# Patient Record
Sex: Female | Born: 1980
Health system: Southern US, Community
[De-identification: ages and names within clinical notes are randomized; demographics above are authoritative.]

## PROBLEM LIST (undated history)

## (undated) DIAGNOSIS — G43909 Migraine, unspecified, not intractable, without status migrainosus: Secondary | ICD-10-CM

## (undated) DIAGNOSIS — K5792 Diverticulitis of intestine, part unspecified, without perforation or abscess without bleeding: Secondary | ICD-10-CM

## (undated) DIAGNOSIS — B019 Varicella without complication: Secondary | ICD-10-CM

## (undated) DIAGNOSIS — F329 Major depressive disorder, single episode, unspecified: Secondary | ICD-10-CM

## (undated) DIAGNOSIS — N3090 Cystitis, unspecified without hematuria: Secondary | ICD-10-CM

## (undated) DIAGNOSIS — E079 Disorder of thyroid, unspecified: Secondary | ICD-10-CM

## (undated) DIAGNOSIS — O09529 Supervision of elderly multigravida, unspecified trimester: Secondary | ICD-10-CM

## (undated) DIAGNOSIS — Z9189 Other specified personal risk factors, not elsewhere classified: Secondary | ICD-10-CM

## (undated) DIAGNOSIS — B029 Zoster without complications: Secondary | ICD-10-CM

## (undated) DIAGNOSIS — D649 Anemia, unspecified: Secondary | ICD-10-CM

## (undated) HISTORY — DX: Migraine, unspecified, not intractable, without status migrainosus: G43.909

## (undated) HISTORY — DX: Anemia, unspecified: D64.9

## (undated) HISTORY — PX: RHINOPLASTY: SUR1284

## (undated) HISTORY — DX: Major depressive disorder, single episode, unspecified: F32.9

## (undated) HISTORY — DX: Diverticulitis of intestine, part unspecified, without perforation or abscess without bleeding: K57.92

## (undated) HISTORY — PX: APPENDECTOMY: SHX54

## (undated) HISTORY — DX: Other specified personal risk factors, not elsewhere classified: Z91.89

## (undated) HISTORY — DX: Varicella without complication: B01.9

## (undated) HISTORY — DX: Disorder of thyroid, unspecified: E07.9

---

## 2012-04-15 ENCOUNTER — Emergency Department: Payer: Self-pay | Admitting: Emergency Medicine

## 2012-04-15 LAB — URINALYSIS, COMPLETE
Blood: NEGATIVE
Glucose,UR: NEGATIVE mg/dL (ref 0–75)
Ketone: NEGATIVE
Nitrite: NEGATIVE
Ph: 7 (ref 4.5–8.0)
Protein: NEGATIVE
RBC,UR: 1 /HPF (ref 0–5)
Specific Gravity: 1.01 (ref 1.003–1.030)
Squamous Epithelial: 1
WBC UR: 1 /HPF (ref 0–5)

## 2012-04-15 LAB — LIPASE, BLOOD: Lipase: 209 U/L (ref 73–393)

## 2012-04-15 LAB — COMPREHENSIVE METABOLIC PANEL
Albumin: 3.8 g/dL (ref 3.4–5.0)
Alkaline Phosphatase: 64 U/L (ref 50–136)
Anion Gap: 6 — ABNORMAL LOW (ref 7–16)
BUN: 11 mg/dL (ref 7–18)
Chloride: 104 mmol/L (ref 98–107)
Co2: 27 mmol/L (ref 21–32)
Creatinine: 0.76 mg/dL (ref 0.60–1.30)
EGFR (African American): >60
Glucose: 89 mg/dL (ref 65–99)
Osmolality: 273 (ref 275–301)
Sodium: 137 mmol/L (ref 136–145)
Total Protein: 8.9 g/dL — ABNORMAL HIGH (ref 6.4–8.2)

## 2012-04-15 LAB — CBC
HCT: 31.1 % — ABNORMAL LOW (ref 35.0–47.0)
MCHC: 31.7 g/dL — ABNORMAL LOW (ref 32.0–36.0)
MCV: 77 fL — ABNORMAL LOW (ref 80–100)
RBC: 4.04 10*6/uL (ref 3.80–5.20)
RDW: 17.8 % — ABNORMAL HIGH (ref 11.5–14.5)
WBC: 8.6 10*3/uL (ref 3.6–11.0)

## 2014-01-14 DIAGNOSIS — Z9189 Other specified personal risk factors, not elsewhere classified: Secondary | ICD-10-CM

## 2014-01-14 HISTORY — DX: Other specified personal risk factors, not elsewhere classified: Z91.89

## 2015-01-06 ENCOUNTER — Encounter: Payer: Self-pay | Admitting: Emergency Medicine

## 2015-01-06 ENCOUNTER — Emergency Department
Admission: EM | Admit: 2015-01-06 | Discharge: 2015-01-06 | Disposition: A | Discharge status: Home / Self Care | Payer: 59 | Attending: Emergency Medicine | Admitting: Emergency Medicine

## 2015-01-06 ENCOUNTER — Emergency Department: Payer: 59

## 2015-01-06 DIAGNOSIS — R109 Unspecified abdominal pain: Secondary | ICD-10-CM

## 2015-01-06 DIAGNOSIS — N3000 Acute cystitis without hematuria: Secondary | ICD-10-CM | POA: Insufficient documentation

## 2015-01-06 DIAGNOSIS — Z3202 Encounter for pregnancy test, result negative: Secondary | ICD-10-CM | POA: Diagnosis not present

## 2015-01-06 DIAGNOSIS — R1909 Other intra-abdominal and pelvic swelling, mass and lump: Secondary | ICD-10-CM | POA: Insufficient documentation

## 2015-01-06 DIAGNOSIS — Z91040 Latex allergy status: Secondary | ICD-10-CM | POA: Insufficient documentation

## 2015-01-06 DIAGNOSIS — N9489 Other specified conditions associated with female genital organs and menstrual cycle: Secondary | ICD-10-CM

## 2015-01-06 LAB — COMPREHENSIVE METABOLIC PANEL
ALBUMIN: 4.6 g/dL (ref 3.5–5.0)
ALT: 23 U/L (ref 14–54)
AST: 21 U/L (ref 15–41)
Alkaline Phosphatase: 58 U/L (ref 38–126)
Anion gap: 6 (ref 5–15)
BUN: 10 mg/dL (ref 6–20)
CO2: 29 mmol/L (ref 22–32)
Calcium: 9.1 mg/dL (ref 8.9–10.3)
Chloride: 99 mmol/L — ABNORMAL LOW (ref 101–111)
Creatinine, Ser: 0.67 mg/dL (ref 0.44–1.00)
GFR calc Af Amer: >60 mL/min (ref >60)
GFR calc non Af Amer: >60 mL/min (ref >60)
GLUCOSE: 100 mg/dL — AB (ref 65–99)
Potassium: 3.5 mmol/L (ref 3.5–5.1)
SODIUM: 134 mmol/L — AB (ref 135–145)
Total Bilirubin: 0.3 mg/dL (ref 0.3–1.2)
Total Protein: 8.7 g/dL — ABNORMAL HIGH (ref 6.5–8.1)

## 2015-01-06 LAB — URINALYSIS COMPLETE WITH MICROSCOPIC (ARMC ONLY)
Bacteria, UA: NONE SEEN
Bilirubin Urine: NEGATIVE
GLUCOSE, UA: NEGATIVE mg/dL
Hgb urine dipstick: NEGATIVE
KETONES UR: NEGATIVE mg/dL
NITRITE: NEGATIVE
Protein, ur: 30 mg/dL — AB
SPECIFIC GRAVITY, URINE: 1.03 (ref 1.005–1.030)
pH: 5 (ref 5.0–8.0)

## 2015-01-06 LAB — CBC
HCT: 33.4 % — ABNORMAL LOW (ref 35.0–47.0)
HEMOGLOBIN: 10.7 g/dL — AB (ref 12.0–16.0)
MCH: 26.3 pg (ref 26.0–34.0)
MCHC: 32 g/dL (ref 32.0–36.0)
MCV: 82.2 fL (ref 80.0–100.0)
Platelets: 300 10*3/uL (ref 150–440)
RBC: 4.07 MIL/uL (ref 3.80–5.20)
RDW: 17 % — AB (ref 11.5–14.5)
WBC: 7.7 10*3/uL (ref 3.6–11.0)

## 2015-01-06 LAB — HCG, QUANTITATIVE, PREGNANCY: hCG, Beta Chain, Quant, S: <1 m[IU]/mL (ref <5)

## 2015-01-06 LAB — POCT PREGNANCY, URINE: Preg Test, Ur: NEGATIVE

## 2015-01-06 MED ORDER — KETOROLAC TROMETHAMINE 30 MG/ML IJ SOLN
30.0000 mg | Freq: Once | INTRAMUSCULAR | Status: AC
  Administered 2015-01-06: 30 mg via INTRAVENOUS
  Filled 2015-01-06: qty 1

## 2015-01-06 MED ORDER — SODIUM CHLORIDE 0.9 % IV BOLUS (SEPSIS)
1000.0000 mL | Freq: Once | INTRAVENOUS | Status: AC
  Administered 2015-01-06: 1000 mL via INTRAVENOUS

## 2015-01-06 MED ORDER — HYDROMORPHONE HCL 1 MG/ML IJ SOLN
1.0000 mg | INTRAMUSCULAR | Status: DC | PRN
  Administered 2015-01-06 (×2): 1 mg via INTRAVENOUS
  Filled 2015-01-06: qty 1

## 2015-01-06 MED ORDER — ONDANSETRON HCL 4 MG/2ML IJ SOLN
4.0000 mg | Freq: Once | INTRAMUSCULAR | Status: AC
  Administered 2015-01-06: 4 mg via INTRAVENOUS
  Filled 2015-01-06: qty 2

## 2015-01-06 MED ORDER — SULFAMETHOXAZOLE-TRIMETHOPRIM 800-160 MG PO TABS: 1.0000 | ORAL_TABLET | Freq: Two times a day (BID) | ORAL | Status: DC

## 2015-01-06 MED ORDER — OXYCODONE-ACETAMINOPHEN 5-325 MG PO TABS: 1.0000 | ORAL_TABLET | Freq: Four times a day (QID) | ORAL | Status: DC | PRN

## 2015-01-06 MED ORDER — HYDROMORPHONE HCL 1 MG/ML IJ SOLN
INTRAMUSCULAR | Status: AC
  Administered 2015-01-06: 1 mg via INTRAVENOUS
  Filled 2015-01-06: qty 1

## 2015-01-06 NOTE — ED Notes (Signed)
Reviewed d/c instructions, pain management, prescriptions, and follow-up care with pt.  Pt verbalized understanding.

## 2015-01-06 NOTE — ED Provider Notes (Signed)
[facility] Emergency Department Provider Note ____________________________________________  Time seen: 1715  I have reviewed the triage vital signs and the nursing notes.  HISTORY  Chief Complaint  Flank Pain  HPI Nicole MerlShasta Pandolfi is a 34 y.o. female reports to the ED for evaluation of sudden onset of right flank pain with referral to the right abdomen and pelvis. She describes the discomfort as severe cramps. She describes onset was shortly after reporting to work this morning. She denies any vomiting but has had intermittent nausea all day. She's been able to tolerate ginger ale, water, and a spring roll without vomiting. She initially thought her symptoms were related to some dyspepsia and earlier dosed Tums and Gas-X without significant relief. The pain intensified as the afternoon progressed. She's noted decreased urinary output, but denies any vaginal discharge or abnormal vaginal bleeding. She reports her LMP was 12/19/14. She denies any other symptoms at this time. She denies any recent travel, bad food, or sick contacts.  History reviewed. No pertinent past medical history.  There are no active problems to display for this patient.  Past Surgical History  Procedure Laterality Date  . Appendectomy    . Cesarean section     No current outpatient prescriptions on file.  Allergies Latex  History reviewed. No pertinent family history.  Social History Social History  Substance Use Topics  . Smoking status: Never Smoker   . Smokeless tobacco: None  . Alcohol Use: Yes   Review of Systems  Constitutional: Negative for fever. Eyes: Negative for visual changes. ENT: Negative for sore throat. Cardiovascular: Negative for chest pain. Respiratory: Negative for shortness of breath. Gastrointestinal:  Positivefor abdominal pain, pelvic pain, flank pain, and nausea. Negative for vomiting and diarrhea. Genitourinary: Negative for dysuria. Musculoskeletal:  Negative for back pain. Skin: Negative for rash. Neurological: Negative for headaches, focal weakness or numbness. ____________________________________________  PHYSICAL EXAM:  VITAL SIGNS: ED Triage Vitals  Enc Vitals Group     BP 01/06/15 1621 134/77 mmHg     Pulse Rate 01/06/15 1621 81     Resp 01/06/15 1621 20     Temp 01/06/15 1621 98 F (36.7 C)     Temp Source 01/06/15 1621 Oral     SpO2 01/06/15 1621 97 %     Weight 01/06/15 1621 215 lb (97.523 kg)     Height 01/06/15 1621 5\' 4"  (1.626 m)     Head Cir --      Peak Flow --      Pain Score 01/06/15 1621 8     Pain Loc --      Pain Edu? --      Excl. in GC? --    Constitutional: Alert and oriented. Well appearing and in no distress. Head: Normocephalic and atraumatic.      Eyes: Conjunctivae are normal. PERRL. Normal extraocular movements      Ears: Canals clear. TMs intact bilaterally.   Nose: No congestion/rhinorrhea.   Mouth/Throat: Mucous membranes are moist.   Neck: Supple. No thyromegaly. Hematological/Lymphatic/Immunological: No cervical lymphadenopathy. Cardiovascular: Normal rate, regular rhythm.  Respiratory: Normal respiratory effort. No wheezes/rales/rhonchi. Gastrointestinal: Soft and  Moderately tender to palp over the right lower quadrant. No distention, rebound, guarding, or organomegaly. GU: Deferred Musculoskeletal: Nontender with normal range of motion in all extremities.  Neurologic:  Normal gait without ataxia. Normal speech and language. No gross focal neurologic deficits are appreciated. Skin:  Skin is warm, dry and intact. No rash noted. Psychiatric: Mood and affect  are normal. Patient exhibits appropriate insight and judgment. ____________________________________________   LABS (pertinent positives/negatives) Labs Reviewed  COMPREHENSIVE METABOLIC PANEL - Abnormal; Notable for the following:    Sodium 134 (*)    Chloride 99 (*)    Glucose, Bld 100 (*)    Total Protein 8.7 (*)     All other components within normal limits  CBC - Abnormal; Notable for the following:    Hemoglobin 10.7 (*)    HCT 33.4 (*)    RDW 17.0 (*)    All other components within normal limits  URINALYSIS COMPLETEWITH MICROSCOPIC ([facility] ONLY) - Abnormal; Notable for the following:    Color, Urine AMBER (*)    APPearance CLOUDY (*)    Protein, ur 30 (*)    Leukocytes, UA 1+ (*)    Squamous Epithelial / LPF 6-30 (*)    All other components within normal limits  POC URINE PREG, ED  POCT PREGNANCY, URINE  ____________________________________________   RADIOLOGY  CT Stone Protocol IMPRESSION: 5.4 cm mixed attenuation right adnexal mass may represent hemorrhagic cyst, neoplasm, ovarian torsion. Recommend pelvic ultrasound for further characterization.  Pelvic/Transvaginal U/S  - pending  ____________________________________________  PROCEDURES  Toradol 30 mg IVP Zofran 4 mg IVP Dilaudid 1 mg IVP q2 hrs PRN NS 1000 ml bolus ____________________________________________  INITIAL IMPRESSION / ASSESSMENT AND PLAN / ED COURSE  Patient notify the time of chart transfer of CT result findings. She will be managed by my colleague and disposition will be determined based on this ultrasound results.  ____________________________________________  FINAL CLINICAL IMPRESSION(S) / ED DIAGNOSES  Final diagnoses:  Right flank pain  Adnexal mass       Lissa Hoard, PA-C 01/06/15 1924  Jeanmarie Plant, MD 01/06/15 2042

## 2015-01-06 NOTE — ED Provider Notes (Signed)
-----------------------------------------   8:32 PM on 01/06/2015 -----------------------------------------   Blood pressure 130/68, pulse 80, temperature 98 F (36.7 C), temperature source Oral, resp. rate 20, height 5\' 4"  (1.626 m), weight 97.523 kg, last menstrual period 12/19/2014, SpO2 98 %.  Assuming care from Ronald LoboJ. Bacon, PA-C.  In short, Nicole Mcintyre is a 34 y.o. female with a chief complaint of Flank Pain .  Refer to the original H&P for additional details.  The current plan of care is to await US results and manage pain.  ----------------------------------------- 11:00 PM on 01/06/2015 -----------------------------------------  Ultrasound findings were discussed with the radiologist. Because there is some resemblance of fetal pole, beta hCG will be added to the labs.   Dr. Vergie LivingPickens was consulted by phone. He advises pain management is a treatment of choice at this time. He states that he will see her in follow-up. She is to call and schedule an appointment.  The patient and her mother were advised of the ultrasound findings. Strict ER return precautions were discussed and patient verbalized understanding. She will be discharged with prescriptions for Percocet and Bactrim.    Chinita PesterCari B Dyon Rotert, FNP 01/06/15 [id number]  Loleta Roseory Forbach, MD 01/06/15 2311

## 2015-01-06 NOTE — ED Notes (Signed)
Sudden onset right flank pain and RLQ pain. Decreased UOP today.

## 2015-01-24 DIAGNOSIS — N920 Excessive and frequent menstruation with regular cycle: Secondary | ICD-10-CM | POA: Diagnosis not present

## 2015-01-24 DIAGNOSIS — N83201 Unspecified ovarian cyst, right side: Secondary | ICD-10-CM | POA: Diagnosis not present

## 2015-01-24 DIAGNOSIS — N946 Dysmenorrhea, unspecified: Secondary | ICD-10-CM | POA: Diagnosis not present

## 2015-02-22 DIAGNOSIS — R1030 Lower abdominal pain, unspecified: Secondary | ICD-10-CM | POA: Diagnosis not present

## 2015-02-22 DIAGNOSIS — N920 Excessive and frequent menstruation with regular cycle: Secondary | ICD-10-CM | POA: Diagnosis not present

## 2015-02-22 DIAGNOSIS — N946 Dysmenorrhea, unspecified: Secondary | ICD-10-CM | POA: Diagnosis not present

## 2015-02-22 DIAGNOSIS — R102 Pelvic and perineal pain: Secondary | ICD-10-CM | POA: Diagnosis not present

## 2015-02-22 DIAGNOSIS — N83201 Unspecified ovarian cyst, right side: Secondary | ICD-10-CM | POA: Diagnosis not present

## 2015-05-05 ENCOUNTER — Ambulatory Visit: Payer: Self-pay | Admitting: Family

## 2015-05-05 ENCOUNTER — Encounter: Payer: Self-pay | Admitting: Physician Assistant

## 2015-05-05 VITALS — BP 110/82 | HR 76 | Temp 98.5°F

## 2015-05-05 DIAGNOSIS — F4321 Adjustment disorder with depressed mood: Secondary | ICD-10-CM

## 2015-05-05 MED ORDER — SERTRALINE HCL 25 MG PO TABS: 25.0000 mg | ORAL_TABLET | Freq: Every day | ORAL | Status: DC

## 2015-05-05 NOTE — Progress Notes (Signed)
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 She has no prior hx of  Psychological issues.Med Hx :+ anemia /iron de , secondary to periods ,taking Multivit .   Fam Hx is negative for mental illness .She lives with BF of 3 yrs  who is moving out to give her space and because of her mood with him. He is ten years younger she states.  She is OCD and this effects her relationship with him as he is a Curatormechanic and comes in dirty and greasy.This is just one thing she notes.  She denies suicide ,homicide or work performance issues in fact has a Production assistant, radiopromotion.  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 She feels this was the beginning of a major stressor in her life , disappointment.   C/C irritability , occasionally extreme  crying .  O/ alert pleasant well groomed young lady , alert oriented x 3 , she is appropriate , verbalises well, shows humor and insight . VSS  A/ situational depression  P / rx of zoloft 25 mg one qd x 3-4 days,then may increase to 2 tabs daily .She is  To follow up with us in 3-4 weeks.She is looking for a primary care provider.  Continue Eap

## 2015-05-26 ENCOUNTER — Ambulatory Visit: Payer: Self-pay | Admitting: Physician Assistant

## 2015-05-26 ENCOUNTER — Encounter: Payer: Self-pay | Admitting: Physician Assistant

## 2015-05-26 VITALS — BP 130/79 | HR 82 | Temp 97.9°F

## 2015-05-26 DIAGNOSIS — F4321 Adjustment disorder with depressed mood: Secondary | ICD-10-CM

## 2015-05-26 MED ORDER — CITALOPRAM HYDROBROMIDE 20 MG PO TABS: 20.0000 mg | ORAL_TABLET | Freq: Every day | ORAL | Status: DC

## 2015-05-26 NOTE — Progress Notes (Signed)
S: c/o continued irritability and depression symptoms, started on zoloft, can't tell any difference, denies si/hi  O: vitals wnl, nad, lungs c t a, cv rrr, neuro intact  A: situational depression  P: celexa 20mg  qd

## 2015-07-03 ENCOUNTER — Encounter: Payer: Self-pay | Admitting: Family

## 2015-07-03 ENCOUNTER — Other Ambulatory Visit (INDEPENDENT_AMBULATORY_CARE_PROVIDER_SITE_OTHER): Payer: 59

## 2015-07-03 ENCOUNTER — Ambulatory Visit (INDEPENDENT_AMBULATORY_CARE_PROVIDER_SITE_OTHER): Payer: 59 | Admitting: Family

## 2015-07-03 VITALS — BP 110/80 | HR 83 | Temp 98.0°F | Resp 16 | Ht 64.25 in | Wt 268.0 lb

## 2015-07-03 DIAGNOSIS — R5383 Other fatigue: Secondary | ICD-10-CM

## 2015-07-03 DIAGNOSIS — M25521 Pain in right elbow: Secondary | ICD-10-CM | POA: Insufficient documentation

## 2015-07-03 LAB — CBC
HEMATOCRIT: 33.4 % — AB (ref 36.0–46.0)
HEMOGLOBIN: 11.3 g/dL — AB (ref 12.0–15.0)
MCHC: 33.7 g/dL (ref 30.0–36.0)
MCV: 87.7 fl (ref 78.0–100.0)
Platelets: 284 10*3/uL (ref 150.0–400.0)
RBC: 3.81 Mil/uL — ABNORMAL LOW (ref 3.87–5.11)
RDW: 18.1 % — AB (ref 11.5–15.5)
WBC: 5.7 10*3/uL (ref 4.0–10.5)

## 2015-07-03 LAB — COMPREHENSIVE METABOLIC PANEL
ALT: 30 U/L (ref 0–35)
AST: 20 U/L (ref 0–37)
Albumin: 4 g/dL (ref 3.5–5.2)
Alkaline Phosphatase: 52 U/L (ref 39–117)
BUN: 12 mg/dL (ref 6–23)
CHLORIDE: 103 meq/L (ref 96–112)
CO2: 24 meq/L (ref 19–32)
Calcium: 9.1 mg/dL (ref 8.4–10.5)
Creatinine, Ser: 0.79 mg/dL (ref 0.40–1.20)
GFR: 106.55 mL/min (ref >60.00)
GLUCOSE: 96 mg/dL (ref 70–99)
POTASSIUM: 4.3 meq/L (ref 3.5–5.1)
Sodium: 137 mEq/L (ref 135–145)
Total Bilirubin: 0.3 mg/dL (ref 0.2–1.2)
Total Protein: 7.9 g/dL (ref 6.0–8.3)

## 2015-07-03 LAB — IBC PANEL
IRON: 26 ug/dL — AB (ref 42–145)
Saturation Ratios: 5.9 % — ABNORMAL LOW (ref 20.0–50.0)
TRANSFERRIN: 317 mg/dL (ref 212.0–360.0)

## 2015-07-03 LAB — HEMOGLOBIN A1C: Hgb A1c MFr Bld: 5.3 % (ref 4.6–6.5)

## 2015-07-03 LAB — VITAMIN D 25 HYDROXY (VIT D DEFICIENCY, FRACTURES): VITD: 29.54 ng/mL — AB (ref 30.00–100.00)

## 2015-07-03 LAB — TSH: TSH: 1.56 u[IU]/mL (ref 0.35–4.50)

## 2015-07-03 NOTE — Assessment & Plan Note (Signed)
BMI of 45.64. Encourage weight loss of 5-10% of current body weight through lifestyle changes. Recommend increasing physical activity to 30 minutes of moderate level activity daily. Encourage nutritional intake that focuses on nutrient dense foods and is moderate, varied, and balanced and is low in saturated fats and processed/sugary foods. Continue to monitor.

## 2015-07-03 NOTE — Progress Notes (Signed)
Subjective:    Patient ID: Nicole Mcintyre, female    DOB: [birth year], 35 y.o.   MRN: [id number]  Chief Complaint  Patient presents with  . Establish Care    TSH check last PA that saw her said she had lumps on her thyroid    HPI:  Nicole MerlShasta Davilla is a 35 y.o. female who  has a past medical history of Depression; Diverticulitis; History of fainting spells of unknown cause; Migraines; Chicken pox; and Thyroid disease. and presents today for an office visit to establish care.   1.) Thyroid disease - Previously diagnosed with thyroid disease. Originally started with mood changes and feeling angry which led her to a counselor. Upon evaluation in employee health it was found that she may have had lumps on her thyroid. No blood work was completed at the time. Since her depression medication has been discontinued. Currently experiencing the associated symptom of gaining weight, decreased appetitie, and hair is dry/coarse compared to where it normally has been. Describes being hot all the time. Does have a history of heavy menstrual cycle averaging about 7 days of heavy menstruation. Not currently on birth control.  2.) Right elbow pain - this is a new problem. Associated symptom of a small mass and pain located in her right elbow has been going on for several weeks. Pain is described as sharp at times. Denies trauma. She is right-hand dominant. Modifying factors include ibuprofen which have helped minimally with her pain. Course of the symptoms appear to be gradually worsening. Does lean on her elbows and her work as a Administrator, sportspatient accounting. Denies numbness or tingling in her distal extremities.   Allergies  Allergen Reactions  . Latex Swelling     Outpatient Prescriptions Prior to Visit  Medication Sig Dispense Refill  . citalopram (CELEXA) 20 MG tablet Take 1 tablet (20 mg total) by mouth daily. 30 tablet 3  . sertraline (ZOLOFT) 25 MG tablet Take 1 tablet (25 mg total) by mouth daily. For  mood/depression. Take 1 tab QD x 3-4 days then increase to 2 tabs QD 60 tablet 0   No facility-administered medications prior to visit.     Past Medical History  Diagnosis Date  . Depression   . Diverticulitis   . History of fainting spells of unknown cause   . Migraines   . Chicken pox   . Thyroid disease      Past Surgical History  Procedure Laterality Date  . Appendectomy    . Cesarean section    . Rhinoplasty       Family History  Problem Relation Age of Onset  . Alcohol abuse Father   . Diabetes Father   . Arthritis Maternal Grandmother   . Breast cancer Maternal Grandmother   . Alcohol abuse Maternal Grandfather   . Diabetes Paternal Grandmother   . Healthy Mother   . Healthy Paternal Grandfather      Social History   Social History  . Marital Status: Single    Spouse Name: N/A  . Number of Children: 1  . Years of Education: 14   Occupational History  . Patient Accounting    Social History Main Topics  . Smoking status: Never Smoker   . Smokeless tobacco: Never Used  . Alcohol Use: 0.6 oz/week    0 Standard drinks or equivalent, 1 Glasses of wine per week  . Drug Use: No  . Sexual Activity: Not on file   Other Topics Concern  . Not on file  Social History Narrative   Fun: Draw, vacation, visit family    Review of Systems  Constitutional: Positive for fatigue. Negative for fever and chills.  Respiratory: Negative for chest tightness and shortness of breath.   Endocrine: Positive for heat intolerance. Negative for cold intolerance.  Genitourinary: Positive for genital sores.      Objective:    BP 110/80 mmHg  Pulse 83  Temp(Src) 98 F (36.7 C) (Oral)  Resp 16  Ht 5' 4.25" (1.632 m)  Wt 268 lb (121.564 kg)  BMI 45.64 kg/m2  SpO2 98% Nursing note and vital signs reviewed.  Physical Exam  Constitutional: She is oriented to person, place, and time. She appears well-developed and well-nourished. No distress.  Cardiovascular: Normal  rate, regular rhythm, normal heart sounds and intact distal pulses.   Pulmonary/Chest: Effort normal and breath sounds normal.  Musculoskeletal:  Right elbow - no obvious deformity, discoloration, or edema. Palpable firm, mobile nodule located posterior to the olecranon process and tender to the touch. Range of motion is within normal limits. Strength is normal. Distal pulses and sensation are intact and appropriate.  Neurological: She is alert and oriented to person, place, and time.  Skin: Skin is warm and dry.  Psychiatric: She has a normal mood and affect. Her behavior is normal. Judgment and thought content normal.       Assessment & Plan:   Problem List Items Addressed This Visit      Other   Fatigue - Primary    Symptoms of fatigue and mood changes with questionable origin and previously concern for thyroid issues. Does have significant history of anemia.  Obtain CBC, CMET, A1c, IBC panel, TSH and Vitamin D to check metabolic causes. Does endorse snorning while sleeping with concern for potential sleep apnea. Encouraged weight loss with BMI of 45. Follow up and additional treatment pending blood work results.       Relevant Orders   CBC   Comprehensive metabolic panel   TSH   Hemoglobin A1c   IBC panel   VITAMIN D 25 Hydroxy (Vit-D Deficiency, Fractures)   Obesity, morbid (HCC)    BMI of 45.64. Encourage weight loss of 5-10% of current body weight through lifestyle changes. Recommend increasing physical activity to 30 minutes of moderate level activity daily. Encourage nutritional intake that focuses on nutrient dense foods and is moderate, varied, and balanced and is low in saturated fats and processed/sugary foods. Continue to monitor.      Right elbow pain    Right elbow pain with concern for bursitis or possible cyst. Treat conservatively with ice and over-the-counter medications as needed for symptom relief and supportive care. Consider ultrasound and possible fine-needle  aspiration if symptoms worsen or do not improve.          I have discontinued Ms. Pae sertraline and citalopram. I am also having her maintain her doxylamine (Sleep) and multivitamin.   Meds ordered this encounter  Medications  . doxylamine, Sleep, (UNISOM) 25 MG tablet    Sig: Take 25 mg by mouth at bedtime as needed.  . Multiple Vitamin (MULTIVITAMIN) capsule    Sig: Take 1 capsule by mouth daily.     Follow-up: Return if symptoms worsen or fail to improve.  Jeanine Luz, FNP

## 2015-07-03 NOTE — Assessment & Plan Note (Signed)
Symptoms of fatigue and mood changes with questionable origin and previously concern for thyroid issues. Does have significant history of anemia.  Obtain CBC, CMET, A1c, IBC panel, TSH and Vitamin D to check metabolic causes. Does endorse snorning while sleeping with concern for potential sleep apnea. Encouraged weight loss with BMI of 45. Follow up and additional treatment pending blood work results.

## 2015-07-03 NOTE — Assessment & Plan Note (Signed)
Right elbow pain with concern for bursitis or possible cyst. Treat conservatively with ice and over-the-counter medications as needed for symptom relief and supportive care. Consider ultrasound and possible fine-needle aspiration if symptoms worsen or do not improve.

## 2015-07-03 NOTE — Progress Notes (Signed)
Pre visit review using our clinic review tool, if applicable. No additional management support is needed unless otherwise documented below in the visit note. 

## 2015-07-03 NOTE — Patient Instructions (Addendum)
Thank you for choosing [facility].  Summary/Instructions:  Ice 2-3 times per day and after work as needed.  Tylenol as needed for discomfort until blood work returns.  Please stop by the lab on the basement level of the building for your blood work. Your results will be released to MyChart (or called to you) after review, usually within 72 hours after test completion. If any changes need to be made, you will be notified at that same time.  If your symptoms worsen or fail to improve, please contact our office for further instruction, or in case of emergency go directly to the emergency room at the closest medical facility.    Fatigue Fatigue is feeling tired all of the time, a lack of energy, or a lack of motivation. Occasional or mild fatigue is often a normal response to activity or life in general. However, long-lasting (chronic) or extreme fatigue may indicate an underlying medical condition. HOME CARE INSTRUCTIONS  Watch your fatigue for any changes. The following actions may help to lessen any discomfort you are feeling:  Talk to your health care provider about how much sleep you need each night. Try to get the required amount every night.  Take medicines only as directed by your health care provider.  Eat a healthy and nutritious diet. Ask your health care provider if you need help changing your diet.  Drink enough fluid to keep your urine clear or pale yellow.  Practice ways of relaxing, such as yoga, meditation, massage therapy, or acupuncture.  Exercise regularly.   Change situations that cause you stress. Try to keep your work and personal routine reasonable.  Do not abuse illegal drugs.  Limit alcohol intake to no more than 1 drink per day for nonpregnant women and 2 drinks per day for men. One drink equals 12 ounces of beer, 5 ounces of wine, or 1 ounces of hard liquor.  Take a multivitamin, if directed by your health care provider. SEEK MEDICAL CARE IF:    Your fatigue does not get better.  You have a fever.   You have unintentional weight loss or gain.  You have headaches.   You have difficulty:   Falling asleep.  Sleeping throughout the night.  You feel angry, guilty, anxious, or sad.   You are unable to have a bowel movement (constipation).   You skin is dry.   Your legs or another part of your body is swollen.  SEEK IMMEDIATE MEDICAL CARE IF:   You feel confused.   Your vision is blurry.  You feel faint or pass out.   You have a severe headache.   You have severe abdominal, pelvic, or back pain.   You have chest pain, shortness of breath, or an irregular or fast heartbeat.   You are unable to urinate or you urinate less than normal.   You develop abnormal bleeding, such as bleeding from the rectum, vagina, nose, lungs, or nipples.  You vomit blood.   You have thoughts about harming yourself or committing suicide.   You are worried that you might harm someone else.    This information is not intended to replace advice given to you by your health care provider. Make sure you discuss any questions you have with your health care provider.   Document Released: 10/28/2006 Document Revised: 01/21/2014 Document Reviewed: 05/04/2013 Elsevier Interactive Patient Education [employer].

## 2015-07-04 ENCOUNTER — Encounter: Payer: Self-pay | Admitting: Family

## 2015-11-13 ENCOUNTER — Emergency Department
Admission: EM | Admit: 2015-11-13 | Discharge: 2015-11-13 | Disposition: A | Discharge status: Home / Self Care | Payer: 59 | Attending: Emergency Medicine | Admitting: Emergency Medicine

## 2015-11-13 ENCOUNTER — Emergency Department: Payer: 59

## 2015-11-13 ENCOUNTER — Encounter: Payer: Self-pay | Admitting: Emergency Medicine

## 2015-11-13 DIAGNOSIS — R079 Chest pain, unspecified: Secondary | ICD-10-CM | POA: Diagnosis not present

## 2015-11-13 DIAGNOSIS — R071 Chest pain on breathing: Secondary | ICD-10-CM | POA: Diagnosis present

## 2015-11-13 DIAGNOSIS — R0789 Other chest pain: Secondary | ICD-10-CM | POA: Insufficient documentation

## 2015-11-13 LAB — COMPREHENSIVE METABOLIC PANEL WITH GFR
ALT: 22 U/L (ref 14–54)
AST: 21 U/L (ref 15–41)
Albumin: 3.8 g/dL (ref 3.5–5.0)
Alkaline Phosphatase: 49 U/L (ref 38–126)
Anion gap: 6 (ref 5–15)
BUN: 10 mg/dL (ref 6–20)
CO2: 26 mmol/L (ref 22–32)
Calcium: 9.2 mg/dL (ref 8.9–10.3)
Chloride: 100 mmol/L — ABNORMAL LOW (ref 101–111)
Creatinine, Ser: 0.79 mg/dL (ref 0.44–1.00)
GFR calc Af Amer: >60 mL/min
GFR calc non Af Amer: >60 mL/min
Glucose, Bld: 91 mg/dL (ref 65–99)
Potassium: 3.7 mmol/L (ref 3.5–5.1)
Sodium: 132 mmol/L — ABNORMAL LOW (ref 135–145)
Total Bilirubin: <0.1 mg/dL — ABNORMAL LOW (ref 0.3–1.2)
Total Protein: 7.9 g/dL (ref 6.5–8.1)

## 2015-11-13 LAB — POCT PREGNANCY, URINE: PREG TEST UR: NEGATIVE

## 2015-11-13 LAB — CBC
HCT: 33.3 % — ABNORMAL LOW (ref 35.0–47.0)
Hemoglobin: 11.1 g/dL — ABNORMAL LOW (ref 12.0–16.0)
MCH: 29.5 pg (ref 26.0–34.0)
MCHC: 33.5 g/dL (ref 32.0–36.0)
MCV: 88 fL (ref 80.0–100.0)
Platelets: 222 K/uL (ref 150–440)
RBC: 3.78 MIL/uL — ABNORMAL LOW (ref 3.80–5.20)
RDW: 15.9 % — ABNORMAL HIGH (ref 11.5–14.5)
WBC: 6.1 K/uL (ref 3.6–11.0)

## 2015-11-13 LAB — FIBRIN DERIVATIVES D-DIMER (ARMC ONLY): FIBRIN DERIVATIVES D-DIMER (ARMC): 303 (ref 0–499)

## 2015-11-13 LAB — TROPONIN I: Troponin I: <0.03 ng/mL

## 2015-11-13 NOTE — ED Notes (Signed)
Pt discharged home after verbalizing understanding of discharge instructions; nad noted. 

## 2015-11-13 NOTE — ED Triage Notes (Signed)
Pt reports chest pain since last night. States the pain is centralized in her chest and radiates through to back. Pt states that pain increases with deep inspiration. Denies nausea, vomiting, and dizziness. Reports that she has had similar pains in the past but this once has lasted longer than normal.

## 2015-11-13 NOTE — ED Notes (Addendum)
Pt reports constant mid-sternal chest pain that radiates to back since yesterday at 3PM. She reports that when she takes a deep breath it is worse. She states that she has had it before, and that it usually goes away within 30 minutes. Denies pain in her calves, sob.

## 2015-11-13 NOTE — ED Provider Notes (Signed)
[facility] Emergency Department Provider Note   ____________________________________________    I have reviewed the triage vital signs and the nursing notes.   HISTORY  Chief Complaint Chest Pain     HPI Nicole MerlShasta Mcintyre is a 35 y.o. female who presents with moderate, aching complaints of chest pain. Patient reports she has been having chest pain in the center of her chest for approximately 24 hours. She reports pain worsens when she takes deep breath. She does smoke intermittently. She has had pain similar to this in the past but it is only lasted minutes at most. She denies recent travel. She denies calf pain or swelling. No fevers or chills. No cough.   Past Medical History:  Diagnosis Date  . Anemia   . Chicken pox   . Depression   . Diverticulitis   . History of fainting spells of unknown cause   . Migraines   . Thyroid disease     Patient Active Problem List   Diagnosis Date Noted  . Fatigue 07/03/2015  . Obesity, morbid (HCC) 07/03/2015  . Right elbow pain 07/03/2015    Past Surgical History:  Procedure Laterality Date  . APPENDECTOMY    . CESAREAN SECTION    . RHINOPLASTY      Prior to Admission medications   Medication Sig Start Date End Date Taking? Authorizing Provider  doxylamine, Sleep, (UNISOM) 25 MG tablet Take 25 mg by mouth at bedtime as needed.    Historical Provider, MD  Multiple Vitamin (MULTIVITAMIN) capsule Take 1 capsule by mouth daily.    Historical Provider, MD     Allergies Latex  Family History  Problem Relation Age of Onset  . Alcohol abuse Father   . Diabetes Father   . Arthritis Maternal Grandmother   . Breast cancer Maternal Grandmother   . Alcohol abuse Maternal Grandfather   . Diabetes Paternal Grandmother   . Healthy Mother   . Healthy Paternal Grandfather     Social History Social History  Substance Use Topics  . Smoking status: Never Smoker  . Smokeless tobacco: Never Used  . Alcohol  use 0.6 oz/week    1 Glasses of wine per week    Review of Systems  Constitutional: No fever/chills   Cardiovascular: As above Respiratory: Mild shortness of breath. Gastrointestinal: No abdominal pain.  No nausea, no vomiting.    Musculoskeletal: Pain radiates to back Skin: Negative for rash. Neurological: Negative for headaches or weakness  10-point ROS otherwise negative.  ____________________________________________   PHYSICAL EXAM:  VITAL SIGNS: ED Triage Vitals  Enc Vitals Group     BP 11/13/15 0918 (!) 111/92     Pulse Rate 11/13/15 0918 89     Resp 11/13/15 0918 16     Temp 11/13/15 0918 98.3 F (36.8 C)     Temp Source 11/13/15 0918 Oral     SpO2 11/13/15 0918 96 %     Weight 11/13/15 0919 240 lb (108.9 kg)     Height 11/13/15 0919 5\' 4"  (1.626 m)     Head Circumference --      Peak Flow --      Pain Score 11/13/15 0917 7     Pain Loc --      Pain Edu? --      Excl. in GC? --     Constitutional: Alert and oriented. No acute distress. Pleasant and interactive Eyes: Conjunctivae are normal.   Nose: No congestion/rhinnorhea. Mouth/Throat: Mucous membranes are moist.  Cardiovascular: Normal rate, regular rhythm. Grossly normal heart sounds.  Good peripheral circulation. Tenderness to palpation along the inferior bilateral borders of the sternum Respiratory: Normal respiratory effort.  No retractions. Lungs CTAB. Gastrointestinal: Soft and nontender. No distention.  No CVA tenderness. Genitourinary: deferred Musculoskeletal: No lower extremity tenderness nor edema.  Warm and well perfused Neurologic:  Normal speech and language. No gross focal neurologic deficits are appreciated.  Skin:  Skin is warm, dry and intact. No rash noted. Psychiatric: Mood and affect are normal. Speech and behavior are normal.  ____________________________________________   LABS (all labs ordered are listed, but only abnormal results are displayed)  Labs Reviewed  CBC    COMPREHENSIVE METABOLIC PANEL  TROPONIN I  FIBRIN DERIVATIVES D-DIMER ([facility] ONLY)   ____________________________________________  EKG  ED ECG REPORT I, Nicole EveryKINNER, Chrishonda Hesch, the attending physician, personally viewed and interpreted this ECG.  Date: 11/13/2015 EKG Time: 9:17 AM Rate: 88 Rhythm: normal sinus rhythm QRS Axis: normal Intervals: normal ST/T Wave abnormalities: normal Conduction Disturbances: none Narrative Interpretation: unremarkable  ____________________________________________  RADIOLOGY  Chest x-ray unremarkable ____________________________________________   PROCEDURES  Procedure(s) performed: No    Critical Care performed: No ____________________________________________   INITIAL IMPRESSION / ASSESSMENT AND PLAN / ED COURSE  Pertinent labs & imaging results that were available during my care of the patient were reviewed by me and considered in my medical decision making (see chart for details).  Patient presents with chest pain with a pleuritic component. No risk factors for PE, we will send d-dimer, labs, chest x-ray and reevaluate.  Clinical Course  Lab work, x-ray is reassuring. Given sternal chest discomfort suspect costochondritis, recommend NSAIDs and supportive care. Return precautions discussed. ____________________________________________   FINAL CLINICAL IMPRESSION(S) / ED DIAGNOSES  Final diagnoses:  Atypical chest pain      NEW MEDICATIONS STARTED DURING THIS VISIT:  New Prescriptions   No medications on file     Note:  This document was prepared using Dragon voice recognition software and may include unintentional dictation errors.    Nicole Everyobert Tate Jerkins, MD 11/13/15 [phone]

## 2015-11-22 ENCOUNTER — Telehealth: Payer: Self-pay | Admitting: Family

## 2015-11-22 NOTE — Telephone Encounter (Signed)
[facility] - Client TELEPHONE ADVICE RECORD [facility] Patient Name: Nicole Mcintyre DOB: 21-Feb-1980 Initial Comment Caller states went to ER last Mon for chest pains, test came back fine, still having chest pain and is having pain when she coughs. Nurse Assessment Nurse: Ladona RidgelGaddy, RN, Felicia Date/Time (Eastern Time): 11/22/2015 3:48:08 PM Confirm and document reason for call. If symptomatic, describe symptoms. You must click the next button to save text entered. ---Mon went to ER for Mon. She had a faint cough that came on with Chest pain for 24 h and went to ER Mon (1 1/2 wks ago) and all tests came back NL. MD dx her with chest inflamation and he wanted her to follow up with PCP but if worse go to ER. She says chest pain is worse but she has cough that is worse. Nothing is helping the cough. Cough really became noticable mainly 3 d after ER visit. No fever now. Has the patient traveled out of the country within the last 30 days? ---No Does the patient have any new or worsening symptoms? ---Yes Will a triage be completed? ---Yes Related visit to physician within the last 2 weeks? ---Marlou SaYesDoes the PT have any chronic conditions? (i.e. diabetes, asthma, etc.) ---No Is the patient pregnant or possibly pregnant? (Ask all females between the ages of 7112-55) ---No Is this a behavioral health or substance abuse call? ---No Guidelines Guideline Title Affirmed Question Affirmed Notes Cough - Acute Productive Chest pain (Exception: MILD central chest pain, present only when coughing) Chest Pain [1] Chest pain lasts > 5 minutes AND [2] age > 30 AND [3] at least one cardiac risk factor (i.e., hypertension, diabetes, obesity, smoker or strong family history of heart disease) Final Disposition User Call EMS 911 Now SoudertonGaddy, RN, SabinalFelicia Comments chest pain is mild until she is coughing badly then it becomes moderate - Chest pain not worse than when seen. No  history of chest problems she is 5\' 4"  and wt is 240# the chest pain was there before the cough was very noticable about 11-12 d ago Referrals GO TO FACILITY UNDECIDED Disagree/Comply: Comply Call Id: [phone]

## 2015-11-22 NOTE — Telephone Encounter (Signed)
Fine with me. Thanks

## 2015-11-22 NOTE — Telephone Encounter (Signed)
Pt called wanting to switch providers from dr Carver Filacalone to [facility] [place].  This is closer to where she live.  Is it ok to schedule. .  Pt also complained that her chest hurts when she cough.  I transferred her to [facility] to be traige

## 2015-11-23 NOTE — Telephone Encounter (Signed)
Ok with me 

## 2015-11-23 NOTE — Telephone Encounter (Signed)
Pt called wanted to be see sooner she has a cough with chest pains.  She spoke to [facility] yesterday and was in the er 11/13/15.  She is transfering from dr Carver Filacalone to you  Her appointment is 12/04/15.   Best number [phone]

## 2015-11-23 NOTE — Telephone Encounter (Signed)
Spoken to patient and schedule appt on 11/27/2015

## 2015-11-23 NOTE — Telephone Encounter (Signed)
Sure, please schedule her for first available 30 minute slot.

## 2015-11-23 NOTE — Telephone Encounter (Signed)
11/20 pt aware

## 2015-11-27 ENCOUNTER — Ambulatory Visit (INDEPENDENT_AMBULATORY_CARE_PROVIDER_SITE_OTHER): Payer: 59 | Admitting: Primary Care

## 2015-11-27 ENCOUNTER — Encounter: Payer: Self-pay | Admitting: Primary Care

## 2015-11-27 VITALS — BP 110/76 | HR 87 | Temp 98.4°F | Ht 65.0 in | Wt 268.4 lb

## 2015-11-27 DIAGNOSIS — R05 Cough: Secondary | ICD-10-CM

## 2015-11-27 DIAGNOSIS — R079 Chest pain, unspecified: Secondary | ICD-10-CM

## 2015-11-27 DIAGNOSIS — R059 Cough, unspecified: Secondary | ICD-10-CM

## 2015-11-27 LAB — CBC WITH DIFFERENTIAL/PLATELET
BASOS ABS: 0 10*3/uL (ref 0.0–0.1)
Basophils Relative: 0.5 % (ref 0.0–3.0)
EOS ABS: 0.3 10*3/uL (ref 0.0–0.7)
EOS PCT: 4.3 % (ref 0.0–5.0)
HCT: 32.9 % — ABNORMAL LOW (ref 36.0–46.0)
HEMOGLOBIN: 10.9 g/dL — AB (ref 12.0–15.0)
Lymphocytes Relative: 48.9 % — ABNORMAL HIGH (ref 12.0–46.0)
Lymphs Abs: 3.7 10*3/uL (ref 0.7–4.0)
MCHC: 33.2 g/dL (ref 30.0–36.0)
MCV: 86.9 fl (ref 78.0–100.0)
MONO ABS: 0.6 10*3/uL (ref 0.1–1.0)
Monocytes Relative: 8.2 % (ref 3.0–12.0)
Neutro Abs: 2.9 10*3/uL (ref 1.4–7.7)
Neutrophils Relative %: 38.1 % — ABNORMAL LOW (ref 43.0–77.0)
Platelets: 289 10*3/uL (ref 150.0–400.0)
RBC: 3.78 Mil/uL — AB (ref 3.87–5.11)
RDW: 15.4 % (ref 11.5–15.5)
WBC: 7.6 10*3/uL (ref 4.0–10.5)

## 2015-11-27 LAB — BASIC METABOLIC PANEL
BUN: 9 mg/dL (ref 6–23)
CHLORIDE: 102 meq/L (ref 96–112)
CO2: 29 meq/L (ref 19–32)
Calcium: 9.3 mg/dL (ref 8.4–10.5)
Creatinine, Ser: 0.81 mg/dL (ref 0.40–1.20)
GFR: 103.28 mL/min (ref >60.00)
GLUCOSE: 91 mg/dL (ref 70–99)
POTASSIUM: 4 meq/L (ref 3.5–5.1)
SODIUM: 138 meq/L (ref 135–145)

## 2015-11-27 MED ORDER — RANITIDINE HCL 150 MG PO TABS: ORAL_TABLET | ORAL | 1 refills | Status: DC

## 2015-11-27 MED ORDER — HYDROCODONE-HOMATROPINE 5-1.5 MG/5ML PO SYRP: 5.0000 mL | ORAL_SOLUTION | Freq: Every evening | ORAL | 0 refills | Status: DC | PRN

## 2015-11-27 NOTE — Assessment & Plan Note (Addendum)
Underwent testing at [facility] ED on 10/30 with reassuring results. Dry cough present today during exam. Lungs clear, no wheezing. Will treat for presumed GERD as cause with Zantac 1-2 times daily for several weeks. She will notify in 1-2 weeks if no improvement in cough and chest pain. May consider PPI if some improvement. May also consider spirometry if no improvement. Rx for Hycodan provided today in order to help her sleep.  All ED notes, imaging, and labs reviewed.

## 2015-11-27 NOTE — Patient Instructions (Signed)
Start ranitidine (Zantac) 150 mg tablets for acid reflux. Take 1 tablet by mouth once or twice daily, everyday.  Please notify me if no improvement in cough in 1-2 weeks.  Complete lab work prior to leaving today. I will notify you of your results once received.   It was a pleasure to meet you today! Please don't hesitate to call me with any questions. [facility]

## 2015-11-27 NOTE — Progress Notes (Signed)
Pre visit review using our clinic review tool, if applicable. No additional management support is needed unless otherwise documented below in the visit note. 

## 2015-11-27 NOTE — Progress Notes (Signed)
Subjective:    Patient ID: Nicole Mcintyre, female    DOB: [birth year], 35 y.o.   MRN: [id number]  HPI  Nicole Mcintyre is a 35 year old female who presents today [facility] and discuss the problems mentioned below. Will obtain old records.  1) Chest Pain: She [facility] ED on 11/13/15 with a 24 hour history of moderate intensity chest pain. Her chest pain was located to the center of her chest with increased pain with deep inspiration. She underwent evaluation with xray, ECG, and blood work (chest xray and ECG unremarkable, d-dimer, troponin, cbc, unremarkable). She was discharged home given reassuring exam.  Since her visit she began a steady, constant cough three days after her visit. She continues to experience the same type of chest pain. She has noticed wheezing at night. She does experience a productive cough with yellow/white sputum. She's taken Robitussin, Acyclovir tablets, cough drops, Tamiflu without improvement.   She's noticed esophageal burning, belching 1-2 times daily for the past 1 month. She's not taken anything OTC for these symptoms. She doesn't feel sick, but does feel tired/weak. She denies fevers, prior history of asthma. She is a non smoker.   Review of Systems  Constitutional: Positive for fatigue. Negative for chills and fever.  HENT: Positive for congestion and postnasal drip.   Eyes: Negative for visual disturbance.  Respiratory: Positive for cough and wheezing. Negative for shortness of breath.   Cardiovascular: Positive for chest pain.  Gastrointestinal:       Esophageal burning, belching  Neurological: Negative for weakness.       Past Medical History:  Diagnosis Date  . Anemia   . Chicken pox   . Depression   . Diverticulitis   . History of fainting spells of unknown cause   . Migraines   . Thyroid disease      Social History   Social History  . Marital status: Single    Spouse name: N/A  . Number of children: 1  . Years of  education: 7814   Occupational History  . Patient Accounting    Social History Main Topics  . Smoking status: Former Games developermoker  . Smokeless tobacco: Never Used  . Alcohol use 0.6 oz/week    1 Glasses of wine per week  . Drug use: No  . Sexual activity: Not on file   Other Topics Concern  . Not on file   Social History Narrative   Fun: Draw, vacation, visit family    Past Surgical History:  Procedure Laterality Date  . APPENDECTOMY    . CESAREAN SECTION    . RHINOPLASTY      Family History  Problem Relation Age of Onset  . Alcohol abuse Father   . Diabetes Father   . Arthritis Maternal Grandmother   . Breast cancer Maternal Grandmother   . Alcohol abuse Maternal Grandfather   . Diabetes Paternal Grandmother   . Healthy Mother   . Healthy Paternal Grandfather     Allergies  Allergen Reactions  . Latex Swelling    No current outpatient prescriptions on file prior to visit.   No current facility-administered medications on file prior to visit.     BP 110/76   Pulse 87   Temp 98.4 F (36.9 C) (Oral)   Ht 5\' 5"  (1.651 m)   Wt 268 lb 6.4 oz (121.7 kg)   LMP 11/18/2015   SpO2 97%   BMI 44.66 kg/m    Objective:  Physical Exam  Constitutional: She appears well-nourished. She does not appear ill.  HENT:  Right Ear: Tympanic membrane and ear canal normal.  Left Ear: Tympanic membrane and ear canal normal.  Nose: Right sinus exhibits no maxillary sinus tenderness and no frontal sinus tenderness. Left sinus exhibits no maxillary sinus tenderness and no frontal sinus tenderness.  Mouth/Throat: Oropharynx is clear and moist.  Eyes: Conjunctivae are normal.  Neck: Neck supple.  Cardiovascular: Normal rate and regular rhythm.   Pulmonary/Chest: Effort normal and breath sounds normal. She has no wheezes. She has no rales.  Lymphadenopathy:    She has no cervical adenopathy.  Skin: Skin is warm and dry.          Assessment & Plan:

## 2015-12-04 ENCOUNTER — Ambulatory Visit: Payer: 59 | Admitting: Primary Care

## 2015-12-06 ENCOUNTER — Telehealth: Payer: Self-pay | Admitting: Primary Care

## 2015-12-06 NOTE — Telephone Encounter (Signed)
Message left for patient to return my call.  

## 2015-12-06 NOTE — Telephone Encounter (Signed)
-----   Message from Doreene NestKatherine K Marsha Hillman, NP sent at 11/27/2015  7:57 PM EST ----- Regarding: Cough/chest pain Please check on patient. Any improvement in cough and chest pain since we started Zantac?

## 2015-12-11 NOTE — Telephone Encounter (Signed)
Spoken to patient and she is having trouble with the pharmacy. Patient have not pick up the Zantac yet. She will call or go by there today. She will let me know.

## 2016-01-01 ENCOUNTER — Telehealth: Payer: Self-pay | Admitting: Primary Care

## 2016-01-01 DIAGNOSIS — R059 Cough, unspecified: Secondary | ICD-10-CM

## 2016-01-01 DIAGNOSIS — R05 Cough: Secondary | ICD-10-CM

## 2016-01-01 NOTE — Telephone Encounter (Signed)
-----   Message from Doreene NestKatherine K Joe Tanney, NP sent at 12/18/2015  7:57 AM EST ----- Regarding: Cough Any improvement in cough since she started taking Zantac?

## 2016-01-01 NOTE — Telephone Encounter (Signed)
Message left for patient to return my call.  

## 2016-01-05 MED ORDER — RANITIDINE HCL 150 MG PO TABS: ORAL_TABLET | ORAL | 1 refills | Status: DC

## 2016-01-05 NOTE — Telephone Encounter (Signed)
Message left for patient to return my call.  

## 2016-01-05 NOTE — Telephone Encounter (Signed)
Patient stated that [facility] did not received the Rx for Zantac. I went ahead and re-sent the prescription as requested from patient.  Patient also stated that she still have the cough and is there any way can Jae DireKate refill the [place]codan as well.

## 2016-01-05 NOTE — Telephone Encounter (Signed)
I cannot refill the hycodan, but she can try Nyquil or Delsym OTC for cough. I strongly encourage her to get the Zantac as ultimately this should help.

## 2016-01-16 NOTE — Telephone Encounter (Signed)
Tried to call patient. However, cannot leave message due to voicemail box is full.

## 2016-01-19 ENCOUNTER — Telehealth: Payer: Self-pay | Admitting: Primary Care

## 2016-01-19 NOTE — Telephone Encounter (Signed)
-----   Message from Doreene NestKatherine K Ardit Danh, NP sent at 01/05/2016  4:27 PM EST ----- Regarding: Cough Any improvement in cough since she picked up the Zantac?

## 2016-01-19 NOTE — Telephone Encounter (Signed)
Message left for patient to return my call.  

## 2016-01-30 NOTE — Telephone Encounter (Signed)
Message left for patient to return my call.  

## 2016-02-02 NOTE — Telephone Encounter (Signed)
Could not leave message since voicemail box is full.  Sent patient a [facility] message.

## 2016-06-12 ENCOUNTER — Encounter: Payer: Self-pay | Admitting: Primary Care

## 2016-06-12 ENCOUNTER — Ambulatory Visit (INDEPENDENT_AMBULATORY_CARE_PROVIDER_SITE_OTHER): Payer: 59 | Admitting: Primary Care

## 2016-06-12 VITALS — BP 118/78 | HR 78 | Temp 98.1°F | Wt 260.0 lb

## 2016-06-12 DIAGNOSIS — R102 Pelvic and perineal pain: Secondary | ICD-10-CM | POA: Diagnosis not present

## 2016-06-12 LAB — POC URINALSYSI DIPSTICK (AUTOMATED)
Bilirubin, UA: NEGATIVE
Glucose, UA: NEGATIVE
Ketones, UA: NEGATIVE
Leukocytes, UA: NEGATIVE
Nitrite, UA: NEGATIVE
PH UA: 5.5 (ref 5.0–8.0)
PROTEIN UA: NEGATIVE
RBC UA: NEGATIVE
UROBILINOGEN UA: 0.2 U/dL

## 2016-06-12 LAB — POCT URINE PREGNANCY: PREG TEST UR: NEGATIVE

## 2016-06-12 NOTE — Progress Notes (Signed)
Subjective:    Patient ID: Nicole Mcintyre, female    DOB: Nov 25, 1980, 36 y.o.   MRN: [id number]  HPI  Nicole Mcintyre is a 36 year old female with a history of ovarian cyst who presents today with a chief complaint of pelvic pain. Her pain is located to the right lower pelvic region that began on 06/08/16. LMP on 05/23/16. Her last ovarian cyst was noted in December 2016 per GYN which did resolve by early January 2017. She attempted to schedule an appointment with GYN but they couldn't see her until mid June. Overall her pain has improved with Ibuprofen but continues to be present at a lower inensity. She has noticed vaginal discharge that is whitish in color, urinary frequency.  She denies hematuria, fevers, dysuria, nausea, vomiting.   Review of Systems  Constitutional: Negative for fever.  Gastrointestinal: Negative for nausea and vomiting.  Genitourinary: Positive for frequency, pelvic pain and vaginal discharge. Negative for dysuria and hematuria.       Past Medical History:  Diagnosis Date  . Anemia   . Chicken pox   . Depression   . Diverticulitis   . History of fainting spells of unknown cause   . Migraines   . Thyroid disease      Social History   Social History  . Marital status: Single    Spouse name: N/A  . Number of children: 1  . Years of education: 71   Occupational History  . Patient Accounting    Social History Main Topics  . Smoking status: Former Games developer  . Smokeless tobacco: Never Used  . Alcohol use 0.6 oz/week    1 Glasses of wine per week  . Drug use: No  . Sexual activity: Not on file   Other Topics Concern  . Not on file   Social History Narrative   Fun: Draw, vacation, visit family    Past Surgical History:  Procedure Laterality Date  . APPENDECTOMY    . CESAREAN SECTION    . RHINOPLASTY      Family History  Problem Relation Age of Onset  . Alcohol abuse Father   . Diabetes Father   . Arthritis Maternal Grandmother   . Breast cancer  Maternal Grandmother   . Alcohol abuse Maternal Grandfather   . Diabetes Paternal Grandmother   . Healthy Mother   . Healthy Paternal Grandfather     Allergies  Allergen Reactions  . Latex Swelling    Current Outpatient Prescriptions on File Prior to Visit  Medication Sig Dispense Refill  . ranitidine (ZANTAC) 150 MG tablet Take 1 tablet by mouth once or twice daily for acid reflux. (Patient not taking: Reported on 06/12/2016) 60 tablet 1   No current facility-administered medications on file prior to visit.     BP 118/78   Pulse 78   Temp 98.1 F (36.7 C) (Oral)   Wt 260 lb (117.9 kg)   LMP 05/23/2016   SpO2 97%   BMI 43.27 kg/m    Objective:   Physical Exam  Constitutional: She appears well-nourished.  Neck: Neck supple.  Cardiovascular: Normal rate and regular rhythm.   Pulmonary/Chest: Effort normal and breath sounds normal.  Abdominal:  Right pelvic tenderness upon examination. No RLQ abdominal pain or rebound tenderness.  Genitourinary: There is no rash, tenderness or lesion on the right labia. There is no rash, tenderness or lesion on the left labia. Cervix exhibits discharge. Cervix exhibits no motion tenderness. Vaginal discharge found.  Skin: Skin  is warm and dry.          Assessment & Plan:  Pelvic Pain:  Also with urinary symptoms and vaginal discharge. Based off of HPI and examination I am suspicious for right ovarian cyst and possible bacterial vs yeast involvement. Wet prep completed and is pending. Pelvic and transvaginal US pending. Continue Ibuprofen PRN, discussed to alternate with tylenol. Do not suspect ovarian rupture, especially since pain has improved. Will await results.  Morrie Sheldonlark,Nicole Kendal, NP

## 2016-06-12 NOTE — Patient Instructions (Signed)
You will be contacted regarding your ultrasound.  Please let us know if you have not heard back within 48 hours.   We will notify you of your vaginal swab specimen once received.  It was a pleasure to see you today!

## 2016-06-13 ENCOUNTER — Other Ambulatory Visit: Payer: Self-pay | Admitting: Primary Care

## 2016-06-13 DIAGNOSIS — B373 Candidiasis of vulva and vagina: Secondary | ICD-10-CM

## 2016-06-13 DIAGNOSIS — N760 Acute vaginitis: Principal | ICD-10-CM

## 2016-06-13 DIAGNOSIS — B3731 Acute candidiasis of vulva and vagina: Secondary | ICD-10-CM

## 2016-06-13 DIAGNOSIS — B9689 Other specified bacterial agents as the cause of diseases classified elsewhere: Secondary | ICD-10-CM

## 2016-06-13 LAB — GC/CHLAMYDIA PROBE AMP
CT Probe RNA: NOT DETECTED
GC PROBE AMP APTIMA: NOT DETECTED

## 2016-06-13 LAB — WET PREP BY MOLECULAR PROBE
Candida species: DETECTED — AB
Gardnerella vaginalis: DETECTED — AB
Trichomonas vaginosis: NOT DETECTED

## 2016-06-13 MED ORDER — FLUCONAZOLE 150 MG PO TABS: 150.0000 mg | ORAL_TABLET | Freq: Once | ORAL | 0 refills | Status: AC

## 2016-06-13 MED ORDER — METRONIDAZOLE 500 MG PO TABS: 500.0000 mg | ORAL_TABLET | Freq: Two times a day (BID) | ORAL | 0 refills | Status: DC

## 2016-06-15 ENCOUNTER — Ambulatory Visit (HOSPITAL_BASED_OUTPATIENT_CLINIC_OR_DEPARTMENT_OTHER)
Admission: RE | Admit: 2016-06-15 | Discharge: 2016-06-15 | Disposition: A | Discharge status: Home / Self Care | Payer: 59 | Source: Ambulatory Visit | Attending: Primary Care | Admitting: Primary Care

## 2016-06-15 ENCOUNTER — Ambulatory Visit (HOSPITAL_BASED_OUTPATIENT_CLINIC_OR_DEPARTMENT_OTHER): Admission: RE | Admit: 2016-06-15 | Payer: 59 | Source: Ambulatory Visit

## 2016-06-15 DIAGNOSIS — R102 Pelvic and perineal pain: Secondary | ICD-10-CM | POA: Diagnosis not present

## 2016-06-15 DIAGNOSIS — R1031 Right lower quadrant pain: Secondary | ICD-10-CM | POA: Diagnosis not present

## 2016-06-17 ENCOUNTER — Encounter: Payer: Self-pay | Admitting: Primary Care

## 2016-06-17 DIAGNOSIS — B3731 Acute candidiasis of vulva and vagina: Secondary | ICD-10-CM

## 2016-06-17 DIAGNOSIS — B373 Candidiasis of vulva and vagina: Secondary | ICD-10-CM

## 2016-06-17 MED ORDER — FLUCONAZOLE 150 MG PO TABS: 150.0000 mg | ORAL_TABLET | Freq: Once | ORAL | 0 refills | Status: AC

## 2016-06-26 ENCOUNTER — Encounter: Payer: Self-pay | Admitting: Primary Care

## 2016-07-02 ENCOUNTER — Ambulatory Visit: Payer: Self-pay | Admitting: Obstetrics and Gynecology

## 2016-09-26 ENCOUNTER — Encounter: Payer: Self-pay | Admitting: Family Medicine

## 2016-09-26 ENCOUNTER — Ambulatory Visit (INDEPENDENT_AMBULATORY_CARE_PROVIDER_SITE_OTHER): Payer: 59 | Admitting: Family Medicine

## 2016-09-26 VITALS — BP 122/78 | HR 79 | Temp 97.8°F | Resp 16 | Ht 65.0 in | Wt 262.6 lb

## 2016-09-26 DIAGNOSIS — T783XXA Angioneurotic edema, initial encounter: Secondary | ICD-10-CM

## 2016-09-26 MED ORDER — PREDNISONE 20 MG PO TABS: 20.0000 mg | ORAL_TABLET | Freq: Every day | ORAL | 0 refills | Status: AC

## 2016-09-26 MED ORDER — DIPHENHYDRAMINE HCL 25 MG PO TABS: 25.0000 mg | ORAL_TABLET | Freq: Four times a day (QID) | ORAL | 0 refills | Status: DC | PRN

## 2016-09-26 NOTE — Patient Instructions (Addendum)
IF you received an x-ray today, you will receive an invoice from [facility]. Please contact [facility] at [phone] with questions or concerns regarding your invoice.   IF you received labwork today, you will receive an invoice from [facility]. Please contact [facility] at [phone] with questions or concerns regarding your invoice.   Our billing staff will not be able to assist you with questions regarding bills from these companies.  You will be contacted with the lab results as soon as they are available. The fastest way to get your results is to activate your My Chart account. Instructions are located on the last page of this paperwork. If you have not heard from us regarding the results in 2 weeks, please contact this office.     Angioedema Angioedema is the sudden swelling of tissue in the body. Angioedema can affect any part of the body, but it most often affects the deeper parts of the skin, causing red, itchy patches (hives) to appear over the affected area. It often begins during the night and is found in the morning. Depending on the cause, angioedema may happen:  Only once.  Several times. It may come back in unpredictable patterns.  Repeatedly for several years. Over time, it may gradually stop coming back.  Angioedema can be life-threatening if it affects the air passages that you breathe through. What are the causes? This condition may be caused by:  Foods, such as milk, eggs, shellfish, wheat, or nuts.  Certain medicines, such as ACE inhibitors, antibiotics, nonsteroidal anti-inflammatory drugs, birth control pills, or dyes used in X-rays.  Insect stings.  Infections.  Angioedema can be inherited, and episodes can be triggered by:  Mild injury.  Dental work.  Surgery.  Stress.  Sudden changes in temperature.  Exercise.  In some cases, the cause of this condition is not known. What are the signs or symptoms? Symptoms of this  condition depend on where the swelling happens. Symptoms may include:  Swollen skin.  Red, itchy patches of skin (hives).  Redness in the affected area.  Pain in the affected area.  Swollen lips or tongue.  Wheezing.  Breathing problems.  If your internal organs are involved, symptoms may also include:  Nausea.  Abdominal pain.  Vomiting.  Difficulty swallowing.  Difficulty passing urine.  How is this diagnosed? This condition may be diagnosed based on:  An exam of the affected area.  Your medical history.  Whether anyone in your family has had this condition before.  A review of any medicines you have been taking.  Tests, including: ? Allergy skin tests to see if the condition was caused by an allergic reaction. ? Blood tests to see if the condition was caused by a gene. ? Tests to check for underlying diseases that could cause the condition.  How is this treated? Treatment for this condition depends on the cause. It may involve any of the following:  If something triggered the condition, making changes to keep it from triggering the condition again.  If the condition affects your breathing, having tubes placed in your airway to keep it open.  Taking medicines to treat symptoms or prevent future episodes. These may include: ? Antihistamines. ? Epinephrine injections. ? Steroids.  If your condition is severe, you may need to be treated at the hospital. Angioedema usually gets better in 24-48 hours. Follow these instructions at home:  Take over-the-counter and prescription medicines only as told by your health care provider.  If  you were given medicines for emergency allergy treatment, always carry them with you.  Wear a medical bracelet as told by your health care provider.  If something triggers your condition, avoid the trigger, if possible.  If your condition is inherited and you are thinking about having children, talk to your health care  provider. It is important to discuss the risks of passing on the condition to your children. Contact a health care provider if:  You have repeated episodes of angioedema.  Episodes of angioedema start to happen more often than they used to, even after you take steps to prevent them.  You have episodes of angioedema that are more severe than they have been before, even after you take steps to prevent them.  You are thinking about having children. Get help right away if:  You have severe swelling of your mouth, tongue, or lips.  You have trouble breathing.  You have trouble swallowing.  You faint. This information is not intended to replace advice given to you by your health care provider. Make sure you discuss any questions you have with your health care provider. Document Released: 03/11/2001 Document Revised: 07/29/2015 Document Reviewed: 07/11/2015 Elsevier Interactive Patient Education  [employer].

## 2016-09-26 NOTE — Progress Notes (Signed)
9/13/20183:09 PM  Nicole Mcintyre Mar 05, 1980, 36 y.o. female [id number]  Chief Complaint  Patient presents with  . lips swollen    left side of face is really swollen according to pt started last night around 9 and pt took 2  of benedryl one last night and one this morning     HPI:   Patient is a 36 y.o. female who presents today for sudden left side lip and cheek swelling that started last night. She denies any insect bites, new foods or exposures. Has never had this happen before. Denies any swelling of tongue, throat, SOB, nausea, vomiting, rash. She denies any fhx of facial swelling. She reports currently better after benadryl  x 2, last dose this am.   She is early first trimester pregnant per home test.  Depression screen [facility] 2/9 09/26/2016  Decreased Interest 0  Down, Depressed, Hopeless 0  PHQ - 2 Score 0    Allergies  Allergen Reactions  . Latex Swelling    Current Outpatient Prescriptions on File Prior to Visit  Medication Sig Dispense Refill  . metroNIDAZOLE (FLAGYL) 500 MG tablet Take 1 tablet (500 mg total) by mouth 2 (two) times daily. (Patient not taking: Reported on 09/26/2016) 14 tablet 0  . ranitidine (ZANTAC) 150 MG tablet Take 1 tablet by mouth once or twice daily for acid reflux. (Patient not taking: Reported on 06/12/2016) 60 tablet 1   No current facility-administered medications on file prior to visit.     Past Medical History:  Diagnosis Date  . Anemia   . Chicken pox   . Depression   . Diverticulitis   . History of fainting spells of unknown cause   . Migraines   . Thyroid disease     Past Surgical History:  Procedure Laterality Date  . APPENDECTOMY    . CESAREAN SECTION    . RHINOPLASTY      Social History  Substance Use Topics  . Smoking status: Former Games developer  . Smokeless tobacco: Never Used  . Alcohol use 0.6 oz/week    1 Glasses of wine per week    Family History  Problem Relation Age of Onset  . Alcohol abuse Father     . Diabetes Father   . Arthritis Maternal Grandmother   . Breast cancer Maternal Grandmother   . Alcohol abuse Maternal Grandfather   . Diabetes Paternal Grandmother   . Healthy Mother   . Healthy Paternal Grandfather     Review of Systems  Constitutional: Negative for chills, fever and malaise/fatigue.  Respiratory: Negative for cough, shortness of breath and wheezing.   Cardiovascular: Negative for chest pain and palpitations.  Gastrointestinal: Negative for abdominal pain, diarrhea, nausea and vomiting.  Skin: Negative for itching and rash.  Neurological: Negative for dizziness.     OBJECTIVE:  Blood pressure 122/78, pulse 79, temperature 97.8 F (36.6 C), temperature source Oral, resp. rate 16, height  (1.651 m), weight 262 lb 9.6 oz (119.1 kg), last menstrual period 05/23/2016, SpO2 98 %.  Physical Exam  Constitutional: She is oriented to person, place, and time and well-developed, well-nourished, and in no distress.  HENT:  Head: Normocephalic and atraumatic.  Right Ear: Hearing, tympanic membrane, external ear and ear canal normal.  Left Ear: Hearing, tympanic membrane, external ear and ear canal normal.  Mouth/Throat: Oropharynx is clear and moist.  Left sided mostly lower lip swelling extending to cheek. No erythema or warmth.   Eyes: Pupils are equal, round, and reactive to  light. EOM are normal.  Neck: Neck supple.  Cardiovascular: Normal rate, regular rhythm and normal heart sounds.  Exam reveals no gallop and no friction rub.   No murmur heard. Pulmonary/Chest: Effort normal and breath sounds normal. She has no wheezes. She has no rales.  Lymphadenopathy:    She has no cervical adenopathy.  Neurological: She is alert and oriented to person, place, and time. Gait normal.  Skin: Skin is warm and dry. No rash noted.     ASSESSMENT and PLAN:  1. Angioedema of lips, initial encounter Most likely idiopathic. Labs to r/o other etiologies. Overall resolving.  Benadryl prn, Rx prednisone in case escalation of symptoms, discussed ssx for which to start prednisone and seek immediate medical care, ie ER. Did discuss prednisone implications for early pregnancy. All questions asked. Patient in understanding and agreement of plan. Patient educational handout given. - CBC with Differential - Comprehensive metabolic panel - Sedimentation Rate - C-reactive protein - C4 complement       Myles LippsIrma M Santiago, MD Primary Care at [facility] [address] [place], [place] [address] Ph.  [phone] Fax [phone]

## 2016-09-27 LAB — CBC WITH DIFFERENTIAL/PLATELET
Basophils Absolute: 0 10*3/uL (ref 0.0–0.2)
Basos: 0 %
EOS (ABSOLUTE): 0.2 10*3/uL (ref 0.0–0.4)
Eos: 3 %
Hematocrit: 30.4 % — ABNORMAL LOW (ref 34.0–46.6)
Hemoglobin: 9.6 g/dL — ABNORMAL LOW (ref 11.1–15.9)
Immature Grans (Abs): 0 10*3/uL (ref 0.0–0.1)
Immature Granulocytes: 0 %
Lymphocytes Absolute: 2.8 10*3/uL (ref 0.7–3.1)
Lymphs: 38 %
MCH: 24.3 pg — ABNORMAL LOW (ref 26.6–33.0)
MCHC: 31.6 g/dL (ref 31.5–35.7)
MCV: 77 fL — ABNORMAL LOW (ref 79–97)
Monocytes Absolute: 0.5 10*3/uL (ref 0.1–0.9)
Monocytes: 7 %
Neutrophils Absolute: 3.9 10*3/uL (ref 1.4–7.0)
Neutrophils: 52 %
Platelets: 385 10*3/uL — ABNORMAL HIGH (ref 150–379)
RBC: 3.95 x10E6/uL (ref 3.77–5.28)
RDW: 19.6 % — ABNORMAL HIGH (ref 12.3–15.4)
WBC: 7.6 10*3/uL (ref 3.4–10.8)

## 2016-09-27 LAB — COMPREHENSIVE METABOLIC PANEL
ALT: 28 IU/L (ref 0–32)
AST: 25 IU/L (ref 0–40)
Albumin/Globulin Ratio: 1.4 (ref 1.2–2.2)
Albumin: 4.4 g/dL (ref 3.5–5.5)
Alkaline Phosphatase: 56 IU/L (ref 39–117)
BUN/Creatinine Ratio: 9 (ref 9–23)
BUN: 7 mg/dL (ref 6–20)
Bilirubin Total: <0.2 mg/dL (ref 0.0–1.2)
CO2: 19 mmol/L — ABNORMAL LOW (ref 20–29)
Calcium: 9.1 mg/dL (ref 8.7–10.2)
Chloride: 102 mmol/L (ref 96–106)
Creatinine, Ser: 0.77 mg/dL (ref 0.57–1.00)
GFR calc Af Amer: 115 mL/min/{1.73_m2} (ref >59)
GFR calc non Af Amer: 100 mL/min/{1.73_m2} (ref >59)
Globulin, Total: 3.2 g/dL (ref 1.5–4.5)
Glucose: 114 mg/dL — ABNORMAL HIGH (ref 65–99)
Potassium: 4.2 mmol/L (ref 3.5–5.2)
Sodium: 138 mmol/L (ref 134–144)
Total Protein: 7.6 g/dL (ref 6.0–8.5)

## 2016-09-27 LAB — SEDIMENTATION RATE: Sed Rate: 44 mm/hr — ABNORMAL HIGH (ref 0–32)

## 2016-09-27 LAB — C4 COMPLEMENT: Complement C4, Serum: 28 mg/dL (ref 14–44)

## 2016-09-27 LAB — C-REACTIVE PROTEIN: CRP: 11.1 mg/L — ABNORMAL HIGH (ref 0.0–4.9)

## 2016-10-08 DIAGNOSIS — Z3A01 Less than 8 weeks gestation of pregnancy: Secondary | ICD-10-CM | POA: Diagnosis not present

## 2016-10-08 DIAGNOSIS — O200 Threatened abortion: Secondary | ICD-10-CM | POA: Diagnosis not present

## 2016-10-08 DIAGNOSIS — Z3201 Encounter for pregnancy test, result positive: Secondary | ICD-10-CM | POA: Diagnosis not present

## 2016-10-08 DIAGNOSIS — O09521 Supervision of elderly multigravida, first trimester: Secondary | ICD-10-CM | POA: Diagnosis not present

## 2016-10-08 DIAGNOSIS — N911 Secondary amenorrhea: Secondary | ICD-10-CM | POA: Diagnosis not present

## 2016-10-23 DIAGNOSIS — Z368A Encounter for antenatal screening for other genetic defects: Secondary | ICD-10-CM | POA: Diagnosis not present

## 2016-10-23 DIAGNOSIS — Z3A09 9 weeks gestation of pregnancy: Secondary | ICD-10-CM | POA: Diagnosis not present

## 2016-10-23 DIAGNOSIS — O09521 Supervision of elderly multigravida, first trimester: Secondary | ICD-10-CM | POA: Diagnosis not present

## 2016-10-23 DIAGNOSIS — Z3689 Encounter for other specified antenatal screening: Secondary | ICD-10-CM | POA: Diagnosis not present

## 2016-10-23 DIAGNOSIS — O200 Threatened abortion: Secondary | ICD-10-CM | POA: Diagnosis not present

## 2016-10-23 LAB — OB RESULTS CONSOLE HEPATITIS B SURFACE ANTIGEN: HEP B S AG: NEGATIVE

## 2016-10-23 LAB — OB RESULTS CONSOLE ANTIBODY SCREEN: ANTIBODY SCREEN: NEGATIVE

## 2016-10-23 LAB — OB RESULTS CONSOLE GC/CHLAMYDIA
Chlamydia: NEGATIVE
Gonorrhea: NEGATIVE

## 2016-10-23 LAB — OB RESULTS CONSOLE RPR: RPR: NONREACTIVE

## 2016-10-23 LAB — OB RESULTS CONSOLE RUBELLA ANTIBODY, IGM: Rubella: IMMUNE

## 2016-10-23 LAB — OB RESULTS CONSOLE ABO/RH: RH TYPE: POSITIVE

## 2016-10-23 LAB — OB RESULTS CONSOLE HIV ANTIBODY (ROUTINE TESTING): HIV: NONREACTIVE

## 2016-10-25 DIAGNOSIS — Z124 Encounter for screening for malignant neoplasm of cervix: Secondary | ICD-10-CM | POA: Diagnosis not present

## 2016-11-13 DIAGNOSIS — Z3A12 12 weeks gestation of pregnancy: Secondary | ICD-10-CM | POA: Diagnosis not present

## 2016-11-13 DIAGNOSIS — Z3682 Encounter for antenatal screening for nuchal translucency: Secondary | ICD-10-CM | POA: Diagnosis not present

## 2016-11-13 DIAGNOSIS — Z1379 Encounter for other screening for genetic and chromosomal anomalies: Secondary | ICD-10-CM | POA: Diagnosis not present

## 2016-11-13 DIAGNOSIS — O09521 Supervision of elderly multigravida, first trimester: Secondary | ICD-10-CM | POA: Diagnosis not present

## 2016-11-13 DIAGNOSIS — O34211 Maternal care for low transverse scar from previous cesarean delivery: Secondary | ICD-10-CM | POA: Diagnosis not present

## 2016-12-13 DIAGNOSIS — Z36 Encounter for antenatal screening for chromosomal anomalies: Secondary | ICD-10-CM | POA: Diagnosis not present

## 2016-12-30 DIAGNOSIS — O34211 Maternal care for low transverse scar from previous cesarean delivery: Secondary | ICD-10-CM | POA: Diagnosis not present

## 2016-12-30 DIAGNOSIS — O99212 Obesity complicating pregnancy, second trimester: Secondary | ICD-10-CM | POA: Diagnosis not present

## 2016-12-30 DIAGNOSIS — O09522 Supervision of elderly multigravida, second trimester: Secondary | ICD-10-CM | POA: Diagnosis not present

## 2016-12-30 DIAGNOSIS — Z3A19 19 weeks gestation of pregnancy: Secondary | ICD-10-CM | POA: Diagnosis not present

## 2017-02-25 DIAGNOSIS — N898 Other specified noninflammatory disorders of vagina: Secondary | ICD-10-CM | POA: Diagnosis not present

## 2017-02-25 DIAGNOSIS — O34211 Maternal care for low transverse scar from previous cesarean delivery: Secondary | ICD-10-CM | POA: Diagnosis not present

## 2017-02-25 DIAGNOSIS — O99212 Obesity complicating pregnancy, second trimester: Secondary | ICD-10-CM | POA: Diagnosis not present

## 2017-02-25 DIAGNOSIS — O09522 Supervision of elderly multigravida, second trimester: Secondary | ICD-10-CM | POA: Diagnosis not present

## 2017-02-25 DIAGNOSIS — Z3A27 27 weeks gestation of pregnancy: Secondary | ICD-10-CM | POA: Diagnosis not present

## 2017-02-28 DIAGNOSIS — Z9889 Other specified postprocedural states: Secondary | ICD-10-CM | POA: Diagnosis not present

## 2017-02-28 DIAGNOSIS — Z3689 Encounter for other specified antenatal screening: Secondary | ICD-10-CM | POA: Diagnosis not present

## 2017-02-28 DIAGNOSIS — O3429 Maternal care due to uterine scar from other previous surgery: Secondary | ICD-10-CM | POA: Diagnosis not present

## 2017-02-28 DIAGNOSIS — O09522 Supervision of elderly multigravida, second trimester: Secondary | ICD-10-CM | POA: Diagnosis not present

## 2017-02-28 DIAGNOSIS — Z23 Encounter for immunization: Secondary | ICD-10-CM | POA: Diagnosis not present

## 2017-02-28 DIAGNOSIS — O99212 Obesity complicating pregnancy, second trimester: Secondary | ICD-10-CM | POA: Diagnosis not present

## 2017-03-14 DIAGNOSIS — Z3A29 29 weeks gestation of pregnancy: Secondary | ICD-10-CM | POA: Diagnosis not present

## 2017-03-14 DIAGNOSIS — O99213 Obesity complicating pregnancy, third trimester: Secondary | ICD-10-CM | POA: Diagnosis not present

## 2017-03-14 DIAGNOSIS — O34211 Maternal care for low transverse scar from previous cesarean delivery: Secondary | ICD-10-CM | POA: Diagnosis not present

## 2017-03-14 DIAGNOSIS — O09299 Supervision of pregnancy with other poor reproductive or obstetric history, unspecified trimester: Secondary | ICD-10-CM | POA: Diagnosis not present

## 2017-04-23 DIAGNOSIS — O3663X3 Maternal care for excessive fetal growth, third trimester, fetus 3: Secondary | ICD-10-CM | POA: Diagnosis not present

## 2017-04-23 DIAGNOSIS — Z3685 Encounter for antenatal screening for Streptococcus B: Secondary | ICD-10-CM | POA: Diagnosis not present

## 2017-04-23 DIAGNOSIS — O09523 Supervision of elderly multigravida, third trimester: Secondary | ICD-10-CM | POA: Diagnosis not present

## 2017-04-23 DIAGNOSIS — Z3A35 35 weeks gestation of pregnancy: Secondary | ICD-10-CM | POA: Diagnosis not present

## 2017-04-23 DIAGNOSIS — O99213 Obesity complicating pregnancy, third trimester: Secondary | ICD-10-CM | POA: Diagnosis not present

## 2017-04-23 DIAGNOSIS — O09513 Supervision of elderly primigravida, third trimester: Secondary | ICD-10-CM | POA: Diagnosis not present

## 2017-04-23 DIAGNOSIS — O34211 Maternal care for low transverse scar from previous cesarean delivery: Secondary | ICD-10-CM | POA: Diagnosis not present

## 2017-04-23 LAB — OB RESULTS CONSOLE GBS: STREP GROUP B AG: NEGATIVE

## 2017-05-03 IMAGING — CT CT RENAL STONE PROTOCOL
1 of 2 series · 15 of 32 positions shown, 19 images · non-contrast
Comparison: None.

CLINICAL DATA: Right flank pain and right lower quadrant pain,
decreased urinary output

EXAM:
CT ABDOMEN AND PELVIS WITHOUT CONTRAST
TECHNIQUE: Multidetector CT imaging of the abdomen and pelvis was performed
following the standard protocol without IV contrast.

[Series 2: stone standard full · axial · 0.79mm/px · z∈[-302,+138]mm · 15 of 97 slices shown, 19 images]
[im 5/97  soft-tissue]
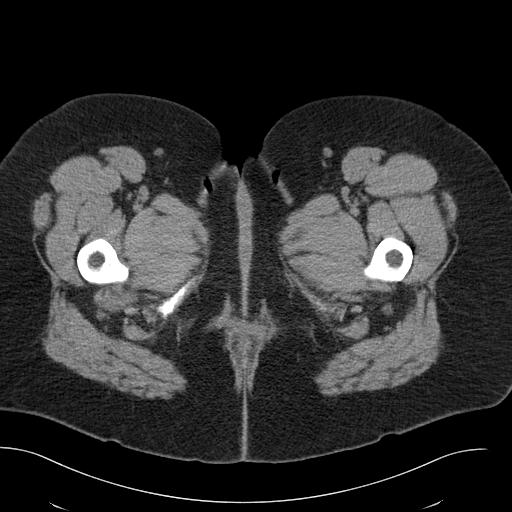
[im 5/97  bone]
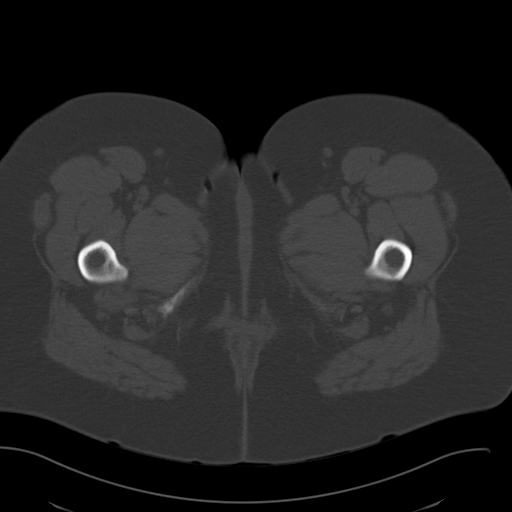
[im 13/97  soft-tissue]
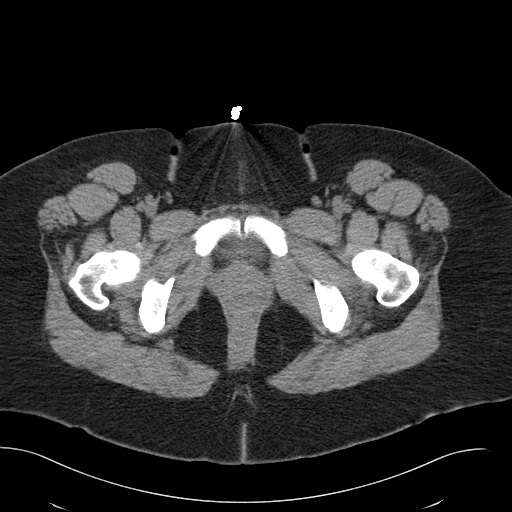
[im 21/97  soft-tissue]
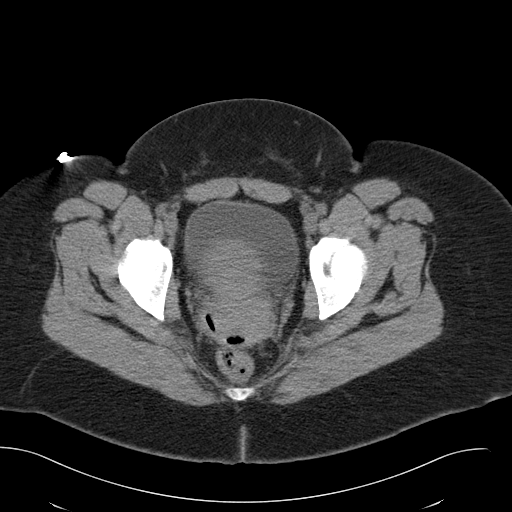
[im 29/97  soft-tissue]
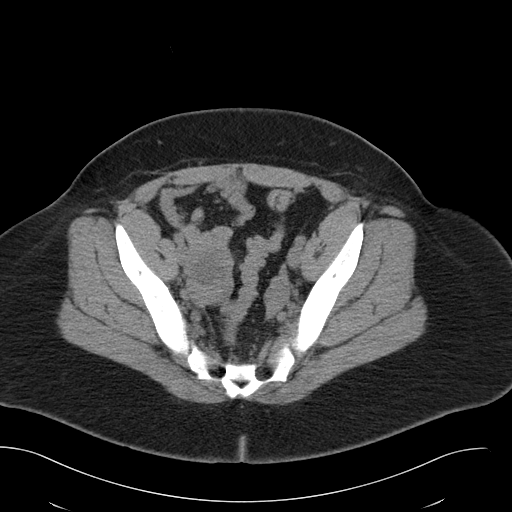
[im 33/97  soft-tissue]
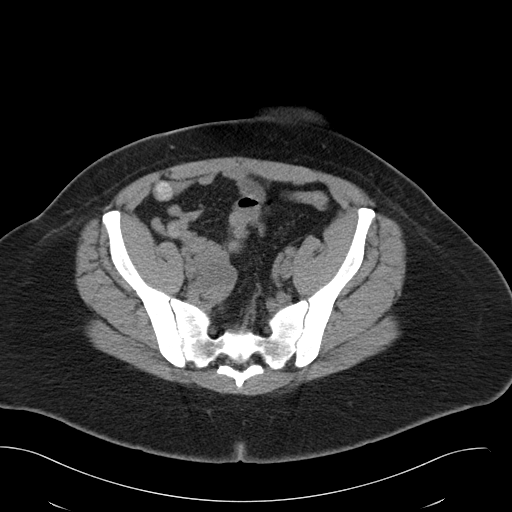
[im 41/97  soft-tissue]
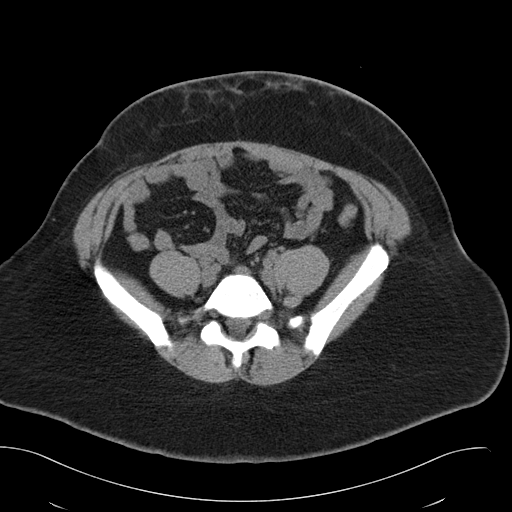
[im 49/97  soft-tissue]
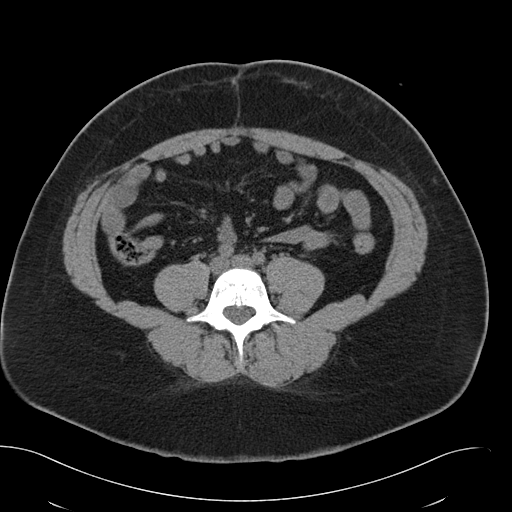
[im 57/97  soft-tissue]
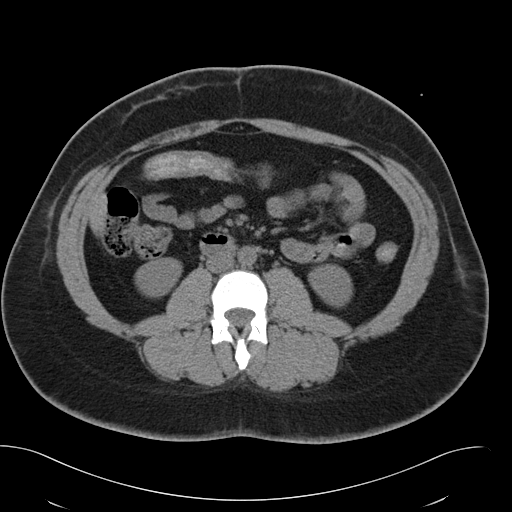
[im 65/97  soft-tissue]
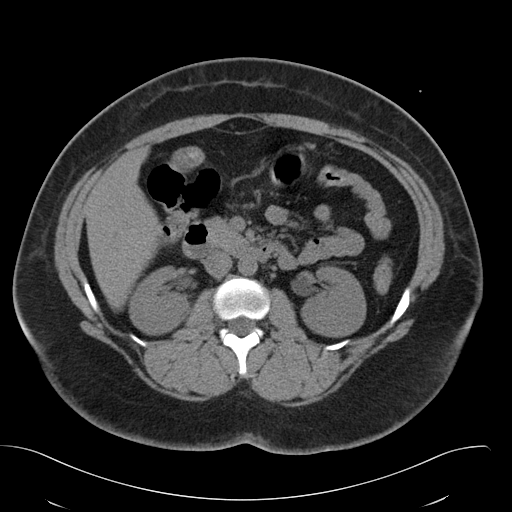
[im 65/97  bone]
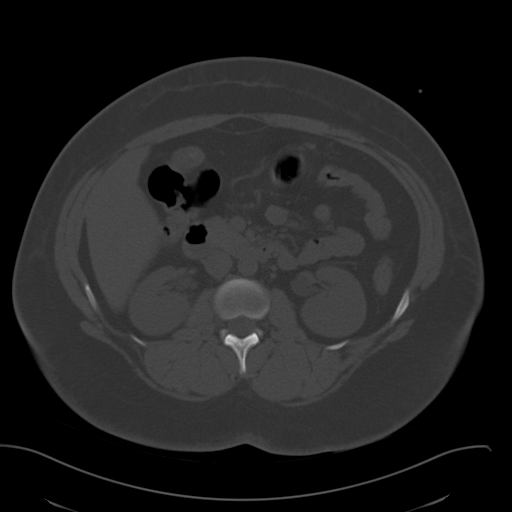
[im 69/97  soft-tissue]
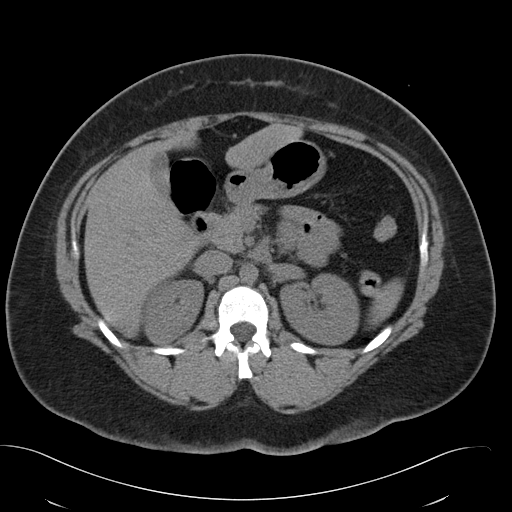
[im 77/97  soft-tissue]
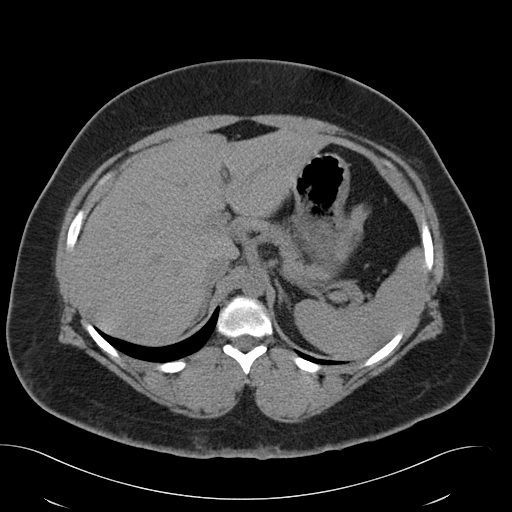
[im 81/97  lung]
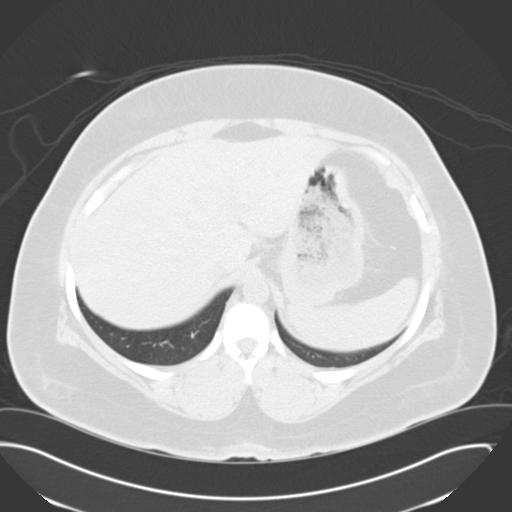
[im 85/97  soft-tissue]
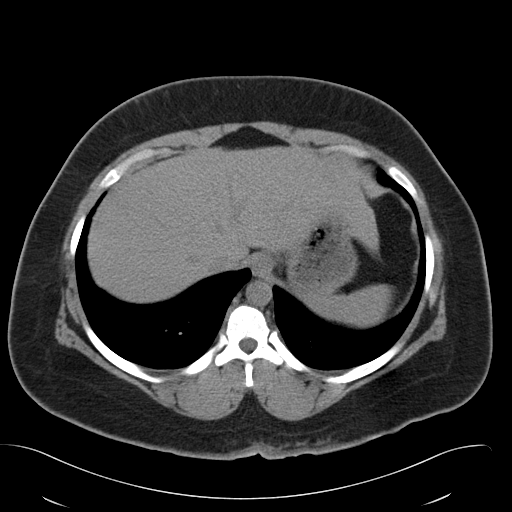
[im 85/97  lung]
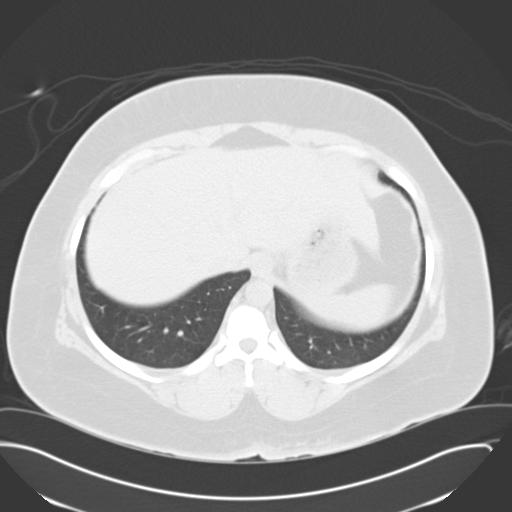
[im 89/97  lung]
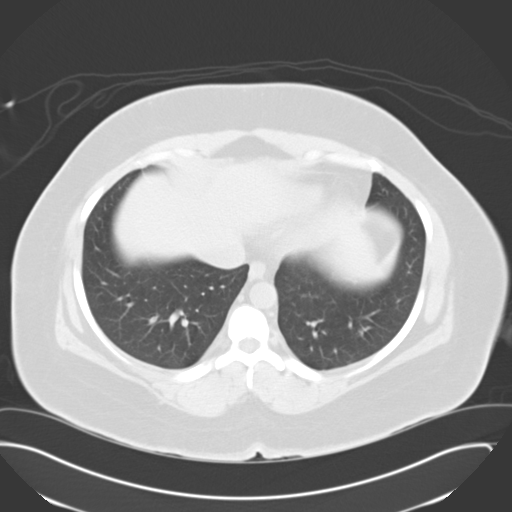
[im 93/97  soft-tissue]
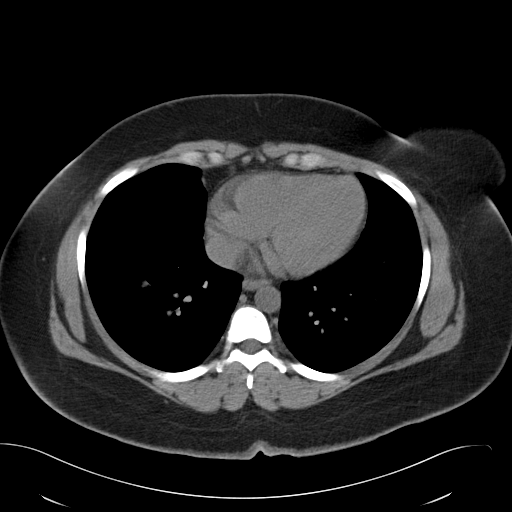
[im 93/97  lung]
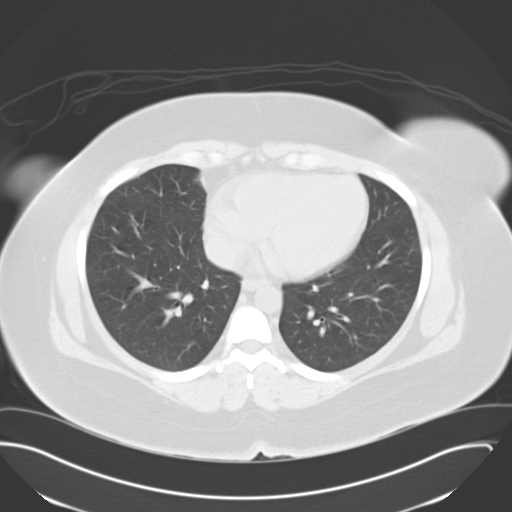

[15 of 32 positions shown; findings below may reference images not displayed]

FINDINGS: Visualized lung bases clear.

Unremarkable liver, contracted gallbladder, spleen, adrenal glands,
pancreas, kidneys. No hydronephrosis or ureterectasis. Unenhanced CT
was performed per clinician order. Lack of IV contrast limits
sensitivity and specificity, especially for evaluation of
abdominal/pelvic solid viscera.

Stomach, small bowel, and colon are nondilated. Appendix surgically
absent. Urinary bladder physiologically distended.

There is a mixed attenuation 5.4 right adnexal mass. No significant
adjacent inflammatory/ edematous change. Uterus and left adnexal
region are grossly unremarkable.

No ascites. No free air. No adenopathy. Regional bones unremarkable.
IMPRESSION: 1. 5.4 mixed attenuation right adnexal mass may represent
hemorrhagic cyst, neoplasm, ovarian torsion. Recommend pelvic
ultrasound for further characterization.

## 2017-05-06 ENCOUNTER — Encounter (HOSPITAL_COMMUNITY): Payer: Self-pay | Admitting: *Deleted

## 2017-05-16 DIAGNOSIS — Z98891 History of uterine scar from previous surgery: Secondary | ICD-10-CM

## 2017-05-16 NOTE — H&P (Signed)
Nicole Mcintyre is a 37 y.o. female G2P1001 at 39+ weeks (EDD 05/26/17 by 7 week [place])  presenting for scheduled repeat c-section.  Prenatal care complicated by prior c-section, declines TOL.   She is AMA but had a low risk Panorama test.   OB History    Gravida  2   Para  1   Term  1   Preterm      AB      Living  1     SAB      TAB      Ectopic      Multiple      Live Births  1         2008 LTCS with single layer closure 4#9oz  Past Medical History:  Diagnosis Date  . AMA (advanced maternal age) multigravida 35+   . Anemia   . Chicken pox   . Depression   . Diverticulitis   . History of fainting spells of unknown cause   . Migraines   . Thyroid disease    Past Surgical History:  Procedure Laterality Date  . APPENDECTOMY    . CESAREAN SECTION    . RHINOPLASTY     Family History: family history includes Alcohol abuse in her father and maternal grandfather; Anemia in her mother; Arthritis in her maternal grandmother; Breast cancer in her maternal grandmother; Diabetes in her father and paternal grandmother; Healthy in her mother and paternal grandfather. Social History:  reports that she has quit smoking. She has never used smokeless tobacco. She reports that she drinks about 0.6 oz of alcohol per week. She reports that she does not use drugs.     Maternal Diabetes: No Genetic Screening: Normal Maternal Ultrasounds/Referrals: Normal Fetal Ultrasounds or other Referrals:  None Maternal Substance Abuse:  No Significant Maternal Medications:  None Significant Maternal Lab Results:  None Other Comments:  None  Review of Systems  Gastrointestinal: Negative for abdominal pain.  Neurological: Negative for headaches.   Maternal Medical History:  Contractions: Frequency: irregular.   Perceived severity is mild.    Fetal activity: Perceived fetal activity is normal.    Prenatal Complications - Diabetes: none.      Last menstrual period 05/23/2016. Maternal  Exam:  Uterine Assessment: Contraction strength is mild.  Contraction frequency is irregular.   Abdomen: Surgical scars: low transverse and appendectomy scar.   Introitus: Normal vulva. Normal vagina.    Physical Exam  Prenatal labs: ABO, Rh: A/Positive/-- (10/10 0000) Antibody: Negative (10/10 0000) Rubella: Immune (10/10 0000) RPR: Nonreactive (10/10 0000)  HBsAg: Negative (10/10 0000)  HIV: Non-reactive (10/10 0000)  GBS: Negative (04/10 0000)  One hour GCT WNL Panorama and AFP WNL    Assessment/Plan: Patient  was counseled regarding the risks and benefits of C-section and the procedure was reviewed in detail.  Risks of bleeding, infection, and possible damage to bowel and bladder were reviewed,  The patient would accept a blood transfusion if needed.  She desires to proceed.  We looked at her prior scars and determined her c-section scar is probably too low to utilize will likely go above that.  Pt fine with that plan   Oliver Pila 05/16/2017, 4:05 PM

## 2017-05-19 ENCOUNTER — Encounter (HOSPITAL_COMMUNITY)
Admission: RE | Admit: 2017-05-19 | Discharge: 2017-05-19 | Disposition: A | Discharge status: Home / Self Care | Payer: 59 | Source: Ambulatory Visit | Attending: Obstetrics and Gynecology | Admitting: Obstetrics and Gynecology

## 2017-05-19 DIAGNOSIS — Z3A39 39 weeks gestation of pregnancy: Secondary | ICD-10-CM | POA: Diagnosis not present

## 2017-05-19 DIAGNOSIS — Z87891 Personal history of nicotine dependence: Secondary | ICD-10-CM | POA: Diagnosis not present

## 2017-05-19 DIAGNOSIS — O26893 Other specified pregnancy related conditions, third trimester: Secondary | ICD-10-CM | POA: Diagnosis not present

## 2017-05-19 DIAGNOSIS — O99214 Obesity complicating childbirth: Secondary | ICD-10-CM | POA: Diagnosis not present

## 2017-05-19 DIAGNOSIS — O34211 Maternal care for low transverse scar from previous cesarean delivery: Secondary | ICD-10-CM | POA: Diagnosis not present

## 2017-05-19 DIAGNOSIS — R05 Cough: Secondary | ICD-10-CM | POA: Diagnosis not present

## 2017-05-19 HISTORY — DX: Supervision of elderly multigravida, unspecified trimester: O09.529

## 2017-05-19 LAB — CBC
HEMATOCRIT: 35.5 % — AB (ref 36.0–46.0)
HEMOGLOBIN: 12.2 g/dL (ref 12.0–15.0)
MCH: 32.2 pg (ref 26.0–34.0)
MCHC: 34.4 g/dL (ref 30.0–36.0)
MCV: 93.7 fL (ref 78.0–100.0)
Platelets: 137 10*3/uL — ABNORMAL LOW (ref 150–400)
RBC: 3.79 MIL/uL — AB (ref 3.87–5.11)
RDW: 14.5 % (ref 11.5–15.5)
WBC: 5.3 10*3/uL (ref 4.0–10.5)

## 2017-05-19 LAB — ABO/RH: ABO/RH(D): A POS

## 2017-05-19 LAB — TYPE AND SCREEN
ABO/RH(D): A POS
Antibody Screen: NEGATIVE

## 2017-05-19 NOTE — Patient Instructions (Signed)
Renata Gambino  05/19/2017   Your procedure is scheduled on:  05/20/2017  Enter through the Main Entrance of [facility] at 0530 AM.  Pick up the phone at the desk and dial [phone]  Call this number if you have problems the morning of surgery:[phone]  Remember:   Do not eat food:(After Midnight) Desps de medianoche.  Do not drink clear liquids: (After Midnight) Desps de medianoche.  Take these medicines the morning of surgery with A SIP OF WATER: none   Do not wear jewelry, make-up or nail polish.  Do not wear lotions, powders, or perfumes. Do not wear deodorant.  Do not shave 48 hours prior to surgery.  Do not bring valuables to the hospital.  [facility] is not   responsible for any belongings or valuables brought to the hospital.  Contacts, dentures or bridgework may not be worn into surgery.  Leave suitcase in the car. After surgery it may be brought to your room.  For patients admitted to the hospital, checkout time is 11:00 AM the day of              discharge.    N/A   Please read over the following fact sheets that you were given:   Surgical Site Infection Prevention

## 2017-05-20 ENCOUNTER — Encounter (HOSPITAL_COMMUNITY): Payer: Self-pay

## 2017-05-20 ENCOUNTER — Inpatient Hospital Stay (HOSPITAL_COMMUNITY): Payer: 59 | Admitting: Anesthesiology

## 2017-05-20 ENCOUNTER — Other Ambulatory Visit: Payer: Self-pay

## 2017-05-20 ENCOUNTER — Encounter (HOSPITAL_COMMUNITY)
Admission: RE | Disposition: A | Discharge status: Home / Self Care | Payer: Self-pay | Source: Ambulatory Visit | Attending: Obstetrics and Gynecology

## 2017-05-20 ENCOUNTER — Inpatient Hospital Stay (HOSPITAL_COMMUNITY)
Admission: RE | Admit: 2017-05-20 | Discharge: 2017-05-23 | DRG: 788 | Disposition: A | Discharge status: Home / Self Care | Payer: 59 | Source: Ambulatory Visit | Attending: Obstetrics and Gynecology | Admitting: Obstetrics and Gynecology

## 2017-05-20 DIAGNOSIS — Z87891 Personal history of nicotine dependence: Secondary | ICD-10-CM | POA: Diagnosis not present

## 2017-05-20 DIAGNOSIS — R05 Cough: Secondary | ICD-10-CM | POA: Diagnosis present

## 2017-05-20 DIAGNOSIS — O26893 Other specified pregnancy related conditions, third trimester: Secondary | ICD-10-CM | POA: Diagnosis present

## 2017-05-20 DIAGNOSIS — Z3A39 39 weeks gestation of pregnancy: Secondary | ICD-10-CM | POA: Diagnosis not present

## 2017-05-20 DIAGNOSIS — O34211 Maternal care for low transverse scar from previous cesarean delivery: Secondary | ICD-10-CM | POA: Diagnosis not present

## 2017-05-20 DIAGNOSIS — O99214 Obesity complicating childbirth: Secondary | ICD-10-CM | POA: Diagnosis present

## 2017-05-20 DIAGNOSIS — Z98891 History of uterine scar from previous surgery: Secondary | ICD-10-CM

## 2017-05-20 HISTORY — DX: Cystitis, unspecified without hematuria: N30.90

## 2017-05-20 HISTORY — DX: Zoster without complications: B02.9

## 2017-05-20 LAB — RPR: RPR: NONREACTIVE

## 2017-05-20 SURGERY — Surgical Case
Anesthesia: Spinal

## 2017-05-20 MED ORDER — ATROPINE SULFATE 0.4 MG/ML IJ SOLN
INTRAMUSCULAR | Status: AC
  Filled 2017-05-20: qty 1

## 2017-05-20 MED ORDER — SODIUM CHLORIDE 0.9 % IR SOLN
Status: DC | PRN
  Administered 2017-05-20: 1

## 2017-05-20 MED ORDER — ACETAMINOPHEN 325 MG PO TABS
650.0000 mg | ORAL_TABLET | ORAL | Status: DC | PRN
  Administered 2017-05-21 – 2017-05-23 (×4): 650 mg via ORAL
  Filled 2017-05-20 (×4): qty 2

## 2017-05-20 MED ORDER — WITCH HAZEL-GLYCERIN EX PADS: 1.0000 "application " | MEDICATED_PAD | CUTANEOUS | Status: DC | PRN

## 2017-05-20 MED ORDER — NALBUPHINE HCL 10 MG/ML IJ SOLN: 5.0000 mg | INTRAMUSCULAR | Status: DC | PRN

## 2017-05-20 MED ORDER — PROMETHAZINE HCL 25 MG/ML IJ SOLN: 6.2500 mg | INTRAMUSCULAR | Status: DC | PRN

## 2017-05-20 MED ORDER — PRENATAL MULTIVITAMIN CH
1.0000 | ORAL_TABLET | Freq: Every day | ORAL | Status: DC
  Administered 2017-05-21 – 2017-05-23 (×3): 1 via ORAL
  Filled 2017-05-20 (×3): qty 1

## 2017-05-20 MED ORDER — LACTATED RINGERS IV SOLN
INTRAVENOUS | Status: DC
  Administered 2017-05-20: 17:00:00 via INTRAVENOUS

## 2017-05-20 MED ORDER — CEFAZOLIN SODIUM-DEXTROSE 2-4 GM/100ML-% IV SOLN
2.0000 g | INTRAVENOUS | Status: AC
  Administered 2017-05-20: 2 g via INTRAVENOUS
  Filled 2017-05-20: qty 100

## 2017-05-20 MED ORDER — TETANUS-DIPHTH-ACELL PERTUSSIS 5-2.5-18.5 LF-MCG/0.5 IM SUSP: 0.5000 mL | Freq: Once | INTRAMUSCULAR | Status: DC

## 2017-05-20 MED ORDER — SENNOSIDES-DOCUSATE SODIUM 8.6-50 MG PO TABS
2.0000 | ORAL_TABLET | ORAL | Status: DC
  Administered 2017-05-21 (×2): 2 via ORAL
  Filled 2017-05-20 (×3): qty 2

## 2017-05-20 MED ORDER — ONDANSETRON HCL 4 MG/2ML IJ SOLN
INTRAMUSCULAR | Status: DC | PRN
  Administered 2017-05-20: 4 mg via INTRAVENOUS

## 2017-05-20 MED ORDER — ATROPINE SULFATE 0.4 MG/ML IJ SOLN
INTRAMUSCULAR | Status: DC | PRN
  Administered 2017-05-20: 0.4 mg via INTRAVENOUS

## 2017-05-20 MED ORDER — SOD CITRATE-CITRIC ACID 500-334 MG/5ML PO SOLN
30.0000 mL | Freq: Once | ORAL | Status: AC
  Administered 2017-05-20: 30 mL via ORAL

## 2017-05-20 MED ORDER — PHENYLEPHRINE 8 MG IN D5W 100 ML (0.08MG/ML) PREMIX OPTIME
INJECTION | INTRAVENOUS | Status: AC
  Filled 2017-05-20: qty 100

## 2017-05-20 MED ORDER — OXYTOCIN 10 UNIT/ML IJ SOLN
INTRAVENOUS | Status: DC | PRN
  Administered 2017-05-20: 40 [IU] via INTRAVENOUS

## 2017-05-20 MED ORDER — ONDANSETRON HCL 4 MG/2ML IJ SOLN
INTRAMUSCULAR | Status: AC
  Filled 2017-05-20: qty 2

## 2017-05-20 MED ORDER — DIBUCAINE 1 % RE OINT: 1.0000 "application " | TOPICAL_OINTMENT | RECTAL | Status: DC | PRN

## 2017-05-20 MED ORDER — KETOROLAC TROMETHAMINE 30 MG/ML IJ SOLN
30.0000 mg | Freq: Once | INTRAMUSCULAR | Status: AC
  Administered 2017-05-20: 30 mg via INTRAVENOUS
  Filled 2017-05-20: qty 1

## 2017-05-20 MED ORDER — SOD CITRATE-CITRIC ACID 500-334 MG/5ML PO SOLN
ORAL | Status: AC
  Filled 2017-05-20: qty 15

## 2017-05-20 MED ORDER — ZOLPIDEM TARTRATE 5 MG PO TABS: 5.0000 mg | ORAL_TABLET | Freq: Every evening | ORAL | Status: DC | PRN

## 2017-05-20 MED ORDER — MORPHINE SULFATE (PF) 4 MG/ML IV SOLN
1.0000 mg | INTRAVENOUS | Status: DC | PRN
  Administered 2017-05-20: 1 mg via INTRAVENOUS
  Administered 2017-05-20: 2 mg via INTRAVENOUS

## 2017-05-20 MED ORDER — SIMETHICONE 80 MG PO CHEW
80.0000 mg | CHEWABLE_TABLET | Freq: Three times a day (TID) | ORAL | Status: DC
  Administered 2017-05-20 – 2017-05-23 (×8): 80 mg via ORAL
  Filled 2017-05-20 (×8): qty 1

## 2017-05-20 MED ORDER — SCOPOLAMINE 1 MG/3DAYS TD PT72
1.0000 | MEDICATED_PATCH | Freq: Once | TRANSDERMAL | Status: DC
  Administered 2017-05-20: 1.5 mg via TRANSDERMAL

## 2017-05-20 MED ORDER — DIPHENHYDRAMINE HCL 25 MG PO CAPS
25.0000 mg | ORAL_CAPSULE | Freq: Four times a day (QID) | ORAL | Status: DC | PRN
  Administered 2017-05-21: 25 mg via ORAL
  Filled 2017-05-20: qty 1

## 2017-05-20 MED ORDER — MIDAZOLAM HCL 2 MG/2ML IJ SOLN: 0.5000 mg | Freq: Once | INTRAMUSCULAR | Status: DC | PRN

## 2017-05-20 MED ORDER — MORPHINE SULFATE (PF) 0.5 MG/ML IJ SOLN
INTRAMUSCULAR | Status: AC
Start: 2017-05-20 — End: 2017-05-20
  Filled 2017-05-20: qty 10

## 2017-05-20 MED ORDER — IBUPROFEN 600 MG PO TABS
600.0000 mg | ORAL_TABLET | Freq: Four times a day (QID) | ORAL | Status: DC
  Administered 2017-05-20 – 2017-05-23 (×12): 600 mg via ORAL
  Filled 2017-05-20 (×12): qty 1

## 2017-05-20 MED ORDER — LACTATED RINGERS IV SOLN
INTRAVENOUS | Status: DC
  Administered 2017-05-20 (×2): via INTRAVENOUS

## 2017-05-20 MED ORDER — FENTANYL CITRATE (PF) 100 MCG/2ML IJ SOLN
INTRAMUSCULAR | Status: AC
  Filled 2017-05-20: qty 2

## 2017-05-20 MED ORDER — MORPHINE SULFATE (PF) 4 MG/ML IV SOLN
INTRAVENOUS | Status: AC
  Filled 2017-05-20: qty 1

## 2017-05-20 MED ORDER — PHENYLEPHRINE HCL 10 MG/ML IJ SOLN
INTRAMUSCULAR | Status: DC | PRN
  Administered 2017-05-20: 80 ug via INTRAVENOUS

## 2017-05-20 MED ORDER — PHENYLEPHRINE 8 MG IN D5W 100 ML (0.08MG/ML) PREMIX OPTIME
INJECTION | INTRAVENOUS | Status: DC | PRN
  Administered 2017-05-20: 60 ug/min via INTRAVENOUS

## 2017-05-20 MED ORDER — MENTHOL 3 MG MT LOZG
1.0000 | LOZENGE | OROMUCOSAL | Status: DC | PRN
  Administered 2017-05-21 – 2017-05-23 (×2): 3 mg via ORAL
  Filled 2017-05-20 (×2): qty 9

## 2017-05-20 MED ORDER — SIMETHICONE 80 MG PO CHEW
80.0000 mg | CHEWABLE_TABLET | ORAL | Status: DC | PRN
  Administered 2017-05-21 (×2): 80 mg via ORAL
  Filled 2017-05-20 (×2): qty 1

## 2017-05-20 MED ORDER — BUPIVACAINE IN DEXTROSE 0.75-8.25 % IT SOLN
INTRATHECAL | Status: DC | PRN
  Administered 2017-05-20: 1.4 mL via INTRATHECAL

## 2017-05-20 MED ORDER — SCOPOLAMINE 1 MG/3DAYS TD PT72
MEDICATED_PATCH | TRANSDERMAL | Status: AC
  Filled 2017-05-20: qty 1

## 2017-05-20 MED ORDER — COCONUT OIL OIL
1.0000 "application " | TOPICAL_OIL | Status: DC | PRN
  Administered 2017-05-21: 1 via TOPICAL
  Filled 2017-05-20: qty 120

## 2017-05-20 MED ORDER — FENTANYL CITRATE (PF) 100 MCG/2ML IJ SOLN
INTRAMUSCULAR | Status: DC | PRN
  Administered 2017-05-20: 10 ug via INTRATHECAL

## 2017-05-20 MED ORDER — OXYTOCIN 10 UNIT/ML IJ SOLN
INTRAMUSCULAR | Status: AC
  Filled 2017-05-20: qty 4

## 2017-05-20 MED ORDER — DEXAMETHASONE SODIUM PHOSPHATE 4 MG/ML IJ SOLN
INTRAMUSCULAR | Status: DC | PRN
  Administered 2017-05-20: 4 mg via INTRAVENOUS

## 2017-05-20 MED ORDER — OXYTOCIN 40 UNITS IN LACTATED RINGERS INFUSION - SIMPLE MED: 2.5000 [IU]/h | INTRAVENOUS | Status: AC

## 2017-05-20 MED ORDER — SIMETHICONE 80 MG PO CHEW
80.0000 mg | CHEWABLE_TABLET | ORAL | Status: DC
  Administered 2017-05-21 – 2017-05-23 (×3): 80 mg via ORAL
  Filled 2017-05-20 (×3): qty 1

## 2017-05-20 MED ORDER — MEPERIDINE HCL 25 MG/ML IJ SOLN: 6.2500 mg | INTRAMUSCULAR | Status: DC | PRN

## 2017-05-20 MED ORDER — MORPHINE SULFATE (PF) 0.5 MG/ML IJ SOLN
INTRAMUSCULAR | Status: DC | PRN
  Administered 2017-05-20: .2 mg via INTRATHECAL

## 2017-05-20 SURGICAL SUPPLY — 35 items
BENZOIN TINCTURE PRP APPL 2/3 (GAUZE/BANDAGES/DRESSINGS) ×2 IMPLANT
CHLORAPREP W/TINT 26ML (MISCELLANEOUS) ×2 IMPLANT
CLAMP CORD UMBIL (MISCELLANEOUS) IMPLANT
CLOSURE STERI STRIP 1/2 X4 (GAUZE/BANDAGES/DRESSINGS) ×2 IMPLANT
CLOTH BEACON ORANGE TIMEOUT ST (SAFETY) ×2 IMPLANT
DRSG OPSITE POSTOP 4X10 (GAUZE/BANDAGES/DRESSINGS) ×2 IMPLANT
ELECT REM PT RETURN 9FT ADLT (ELECTROSURGICAL) ×2
ELECTRODE REM PT RTRN 9FT ADLT (ELECTROSURGICAL) ×1 IMPLANT
EXTRACTOR VACUUM KIWI (MISCELLANEOUS) IMPLANT
GLOVE BIO SURGEON STRL SZ 6.5 (GLOVE) ×2 IMPLANT
GLOVE BIOGEL PI IND STRL 7.0 (GLOVE) ×1 IMPLANT
GLOVE BIOGEL PI INDICATOR 7.0 (GLOVE) ×1
GOWN STRL REUS W/TWL LRG LVL3 (GOWN DISPOSABLE) ×4 IMPLANT
KIT ABG SYR 3ML LUER SLIP (SYRINGE) IMPLANT
NEEDLE HYPO 25X5/8 SAFETYGLIDE (NEEDLE) IMPLANT
NS IRRIG 1000ML POUR BTL (IV SOLUTION) ×2 IMPLANT
PACK C SECTION WH (CUSTOM PROCEDURE TRAY) ×2 IMPLANT
PAD OB MATERNITY 4.3X12.25 (PERSONAL CARE ITEMS) ×2 IMPLANT
PENCIL SMOKE EVAC W/HOLSTER (ELECTROSURGICAL) ×2 IMPLANT
RTRCTR C-SECT PINK 25CM LRG (MISCELLANEOUS) ×2 IMPLANT
STRIP CLOSURE SKIN 1/2X4 (GAUZE/BANDAGES/DRESSINGS) IMPLANT
SUT CHROMIC 1 CTX 36 (SUTURE) ×4 IMPLANT
SUT PLAIN 0 NONE (SUTURE) IMPLANT
SUT PLAIN 2 0 XLH (SUTURE) ×2 IMPLANT
SUT VIC AB 0 CT1 27 (SUTURE) ×2
SUT VIC AB 0 CT1 27XBRD ANBCTR (SUTURE) ×2 IMPLANT
SUT VIC AB 2-0 CT1 27 (SUTURE) ×1
SUT VIC AB 2-0 CT1 TAPERPNT 27 (SUTURE) ×1 IMPLANT
SUT VIC AB 3-0 CT1 27 (SUTURE)
SUT VIC AB 3-0 CT1 TAPERPNT 27 (SUTURE) IMPLANT
SUT VIC AB 3-0 SH 27 (SUTURE) ×1
SUT VIC AB 3-0 SH 27X BRD (SUTURE) ×1 IMPLANT
SUT VIC AB 4-0 KS 27 (SUTURE) ×2 IMPLANT
TOWEL OR 17X24 6PK STRL BLUE (TOWEL DISPOSABLE) ×2 IMPLANT
TRAY FOLEY W/BAG SLVR 14FR LF (SET/KITS/TRAYS/PACK) ×2 IMPLANT

## 2017-05-20 NOTE — Addendum Note (Signed)
Addendum  created 05/20/17 1408 by Shanon Payor, CRNA   Sign clinical note

## 2017-05-20 NOTE — Transfer of Care (Signed)
Immediate Anesthesia Transfer of Care Note  Patient: Nicole Mcintyre  Procedure(s) Performed: REPEAT CESAREAN SECTION (N/A )  Patient Location: PACU  Anesthesia Type:Spinal  Level of Consciousness: awake, alert  and oriented  Airway & Oxygen Therapy: Patient Spontanous Breathing  Post-op Assessment: Report given to RN and Post -op Vital signs reviewed and stable  Post vital signs: Reviewed and stable HR 66, RR 12, BP 123/71, SaO2 98%  Last Vitals:  Vitals Value Taken Time  BP 123/71 05/20/2017  9:00 AM  Temp    Pulse 58 05/20/2017  9:03 AM  Resp 15 05/20/2017  9:03 AM  SpO2 98 % 05/20/2017  9:03 AM  Vitals shown include unvalidated device data.  Last Pain:  Vitals:   05/20/17 0553  TempSrc: Oral         Complications: No apparent anesthesia complications

## 2017-05-20 NOTE — Anesthesia Preprocedure Evaluation (Addendum)
Anesthesia Evaluation  Patient identified by MRN, date of birth, ID band Patient awake    Reviewed: Allergy & Precautions, NPO status , Patient's Chart, lab work & pertinent test results  History of Anesthesia Complications Negative for: history of anesthetic complications  Airway Mallampati: I  TM Distance: >3 FB Neck ROM: Full    Dental  (+) Dental Advisory Given   Pulmonary Recent URI , former smoker,    breath sounds clear to auscultation       Cardiovascular negative cardio ROS   Rhythm:Regular Rate:Normal     Neuro/Psych  Headaches,    GI/Hepatic Neg liver ROS, GERD  Poorly Controlled,  Endo/Other  Morbid obesity  Renal/GU negative Renal ROS     Musculoskeletal   Abdominal (+) + obese,   Peds  Hematology plt 137k   Anesthesia Other Findings   Reproductive/Obstetrics (+) Pregnancy                            Anesthesia Physical Anesthesia Plan  ASA: III  Anesthesia Plan: Spinal   Post-op Pain Management:    Induction:   PONV Risk Score and Plan: 2 and Ondansetron, Dexamethasone, Treatment may vary due to age or medical condition and Scopolamine patch - Pre-op  Airway Management Planned: Natural Airway  Additional Equipment:   Intra-op Plan:   Post-operative Plan:   Informed Consent: I have reviewed the patients History and Physical, chart, labs and discussed the procedure including the risks, benefits and alternatives for the proposed anesthesia with the patient or authorized representative who has indicated his/her understanding and acceptance.   Dental advisory given  Plan Discussed with: CRNA and Surgeon  Anesthesia Plan Comments: (Plan routine monitors, SAB)       Anesthesia Quick Evaluation

## 2017-05-20 NOTE — Anesthesia Postprocedure Evaluation (Signed)
Anesthesia Post Note  Patient: Nicole Mcintyre  Procedure(s) Performed: REPEAT CESAREAN SECTION (N/A )     Patient location during evaluation: Mother Baby Anesthesia Type: Spinal Level of consciousness: awake and alert and oriented Pain management: satisfactory to patient Vital Signs Assessment: post-procedure vital signs reviewed and stable Respiratory status: spontaneous breathing and nonlabored ventilation Cardiovascular status: stable Postop Assessment: no headache, no backache, patient able to bend at knees, no signs of nausea or vomiting and adequate PO intake Anesthetic complications: no    Last Vitals:  Vitals:   05/20/17 1205 05/20/17 1310  BP: 115/70 118/81  Pulse: 61 62  Resp: 18 18  Temp:  36.9 C  SpO2: 100% 98%    Last Pain:  Vitals:   05/20/17 1310  TempSrc: Axillary  PainSc: 3    Pain Goal:                 Laporchia Nakajima

## 2017-05-20 NOTE — Lactation Note (Signed)
This note was copied from a baby's chart. Lactation Consultation Note  Patient Name: Nicole Mcintyre [id number] Date: 05/20/2017 Reason for consult: Follow-up assessment  Initial visit at 9 hours of life. Mom is a P2 who nursed her 1st child for 2 months. Mom was assisted w/latching infant with the "teacup hold." Infant latched well. This LC had to exit the room, but when I returned, Mom reported that infant had fed for 30 minutes with multiple swallows noted.  Mom was made aware of O/P services, breastfeeding support groups, community resources, and our phone # for post-discharge questions.    Lurline [facility]are [facility] 05/20/2017, 7:17 PM

## 2017-05-20 NOTE — Anesthesia Postprocedure Evaluation (Signed)
Anesthesia Post Note  Patient: Nicole Mcintyre  Procedure(s) Performed: REPEAT CESAREAN SECTION (N/A )     Patient location during evaluation: PACU Anesthesia Type: Spinal Level of consciousness: awake and alert, oriented and patient cooperative Pain management: pain level controlled Vital Signs Assessment: post-procedure vital signs reviewed and stable Respiratory status: spontaneous breathing, nonlabored ventilation and respiratory function stable Cardiovascular status: blood pressure returned to baseline and stable Postop Assessment: spinal receding, patient able to bend at knees and no apparent nausea or vomiting Anesthetic complications: no    Last Vitals:  Vitals:   05/20/17 1000 05/20/17 1008  BP: 120/82 107/77  Pulse: 64 (!) 59  Resp: 15 16  Temp:  36.9 C  SpO2: 100% 100%    Last Pain:  Vitals:   05/20/17 1008  TempSrc: Axillary  PainSc:    Pain Goal:                 Charle Mclaurin,E. Sherree Shankman

## 2017-05-20 NOTE — Op Note (Signed)
Operative Note    Preoperative Diagnosis Term pregnancy at 39 1/7 weeks Prior c-section, declines TOL  Postoperative Diagnosis same  Procedure Repeat low transverse c-section with two layer closure of uterus  Surgeon Huel Cote, MD Genice Rouge, RNFA  Anesthesia Spinal  Fluids: EBL UOP clear IVF  Findings Viable female infant apgars 8,9. Weight pending.  Normal uterus and tubes.  Left ovary WNL.  Right ovary adherent to posterior uterus.  Some scar tissue in LRQ palpated but not in ueruine field  Specimen Placenta to L&D   Procedure Note Patient was taken to the operating room where spinal anesthesia was obtained and found to be adequate by Allis clamp test. She was prepped and draped in the normal sterile fashion in the dorsal supine position with a leftward tilt. An appropriate time out was performed. A Pfannenstiel skin incision was then made through a pre-existing scar with the scalpel and carried through to the underlying layer of fascia by sharp dissection and Bovie cautery. The fascia was nicked in the midline and the incision was extended laterally with Mayo scissors. The inferior aspect of the incision was grasped Coker clamps and dissected off the underlying rectus muscles. In a similar fashion the superior aspect was dissected off the rectus muscles. Rectus muscles were separated in the midline and the peritoneal cavity entered bluntly. The peritoneal incision was then extended both superiorly and inferiorly with careful attention to avoid both bowel and bladder. The Alexis self-retaining wound retractor was then placed within the incision and the lower uterine segment exposed. The bladder flap was developed with Metzenbaum scissors and pushed away from the lower uterine segment. The lower uterine segment was then incised in a transverse fashion and the cavity itself entered bluntly. The incision was extended bluntly. The infant's head was then  lifted and delivered from the incision without difficulty. The remainder of the infant delivered and the nose and mouth bulb suctioned with the cord clamped and cut as well. The infant was handed off to the waiting pediatricians. The placenta was then spontaneously expressed from the uterus and the uterus cleared of all clots and debris with moist lap sponge. The uterine incision was then repaired in 2 layers the first layer was a running locked layer of 1-0 chromic and the second an imbricating layer of the same suture. The tubes and ovaries were inspected and the gutters cleared of all clots and debris. The uterine incision was inspected and found to be hemostatic. All instruments and sponges as well as the Alexis retractor were then removed from the abdomen. The rectus muscles and peritoneum were then reapproximated with a running suture of 2-0 Vicryl.  One additional suture of 3-0 vicryl on an SH needle closed a small area at the top of the rectus muscles that pulled through.  The fascia was then closed with 0 Vicryl in a running fashion. Subcutaneous tissue was reapproximated with 3-0 plain in a running fashion. The skin was closed with a subcuticular stitch of 4-0 Vicryl on a Keith needle and then reinforced with benzoin and Steri-Strips. At the conclusion of the procedure all instruments and sponge counts were correct. Patient was taken to the recovery room in good condition with her baby accompanying her skin to skin.

## 2017-05-20 NOTE — Progress Notes (Signed)
Patient ID: Nicole Mcintyre, female   DOB: [birth year], 37 y.o.   MRN: [id number] Per pt no changes in dictated H&P except mild head cold with congestion, no fever.  Brief exam WNL.  Ready to proceed.

## 2017-05-20 NOTE — Anesthesia Procedure Notes (Signed)
Spinal  Patient location during procedure: OR End time: 05/20/2017 7:35 AM Staffing Anesthesiologist: Jairo Ben, MD Performed: anesthesiologist  Preanesthetic Checklist Completed: patient identified, surgical consent, pre-op evaluation, timeout performed, IV checked, risks and benefits discussed and monitors and equipment checked Spinal Block Patient position: sitting Prep: site prepped and draped and DuraPrep Patient monitoring: blood pressure, continuous pulse ox, cardiac monitor and heart rate Approach: midline Location: L3-4 Injection technique: single-shot Needle Needle type: Pencan  Needle gauge: 24 G Needle length: 9 cm Assessment Sensory level: T4 Additional Notes Pt identified in Operating room.  Monitors applied. Working IV access confirmed. Sterile prep, drape lumbar spine.  1% lido local L 3,4.  #24ga Pencan into clear CSF L 3,4.  10.5mg  0.75% Bupivacaine with dextrose, morphine, fentanyl injected with asp CSF beginning and end of injection.  Patient asymptomatic, VSS, no heme aspirated, tolerated well.  Sandford Craze, MD

## 2017-05-21 ENCOUNTER — Encounter (HOSPITAL_COMMUNITY): Payer: Self-pay | Admitting: Obstetrics and Gynecology

## 2017-05-21 LAB — CBC
HEMATOCRIT: 31.3 % — AB (ref 36.0–46.0)
Hemoglobin: 10.6 g/dL — ABNORMAL LOW (ref 12.0–15.0)
MCH: 32.2 pg (ref 26.0–34.0)
MCHC: 33.9 g/dL (ref 30.0–36.0)
MCV: 95.1 fL (ref 78.0–100.0)
PLATELETS: 133 10*3/uL — AB (ref 150–400)
RBC: 3.29 MIL/uL — ABNORMAL LOW (ref 3.87–5.11)
RDW: 14.1 % (ref 11.5–15.5)
WBC: 7.4 10*3/uL (ref 4.0–10.5)

## 2017-05-21 LAB — BIRTH TISSUE RECOVERY COLLECTION (PLACENTA DONATION)

## 2017-05-21 MED ORDER — OXYCODONE HCL 5 MG PO TABS
5.0000 mg | ORAL_TABLET | ORAL | Status: DC | PRN
  Administered 2017-05-21 – 2017-05-23 (×4): 5 mg via ORAL
  Filled 2017-05-21 (×4): qty 1

## 2017-05-21 MED ORDER — DEXTROMETHORPHAN POLISTIREX ER 30 MG/5ML PO SUER
30.0000 mg | Freq: Two times a day (BID) | ORAL | Status: DC | PRN
  Administered 2017-05-21 – 2017-05-23 (×4): 30 mg via ORAL
  Filled 2017-05-21 (×8): qty 5

## 2017-05-21 NOTE — Lactation Note (Signed)
This note was copied from a baby's chart. Lactation Consultation Note  Patient Name: Nicole Mcintyre RUEAV'W Date: 05/21/2017 Reason for consult: Follow-up assessment;Term   Follow up with mom of 29 hour old infant. Infant with 11 BF for 10-30 minutes, 4 voids and 6 stools in the last 24 hours. Infant weight 6 pounds 9.1 ounces with 6% weight loss in the first 24 hours. Infant currently asleep in GM arms.   Mom was pumping when LC entered room, she was concerned she was not able to pump milk. Worked with her on hand expression post pumping and colostrum was noted from both breasts. Mom was pleased to see it. Spoons obtained for mom to collect milk with hand expression to feed to infant.   Mom feels infant is feeding well. She has no further questions/concerns at this time. Enc mom to pump post BF and follow with hand expression, mom to offer infant any EBM that is expressed.   Enc mom to call out for feeding assistance as needed. Report to Dolly Rias, RN.    Maternal Data Formula Feeding for Exclusion: No Has patient been taught Hand Expression?: Yes(reviewed with mom ) Does the patient have breastfeeding experience prior to this delivery?: Yes  Feeding Feeding Type: Breast Fed Length of feed: 15 min  LATCH Score                   Interventions Interventions: Hand express  Lactation Tools Discussed/Used WIC Program: Yes Pump Review: Setup, frequency, and cleaning;Milk Storage Initiated by:: Reviewed and encouraged after BF, followed by hand expression   Consult Status Consult Status: Follow-up Date: 05/22/17 Follow-up type: In-patient    Silas Flood Rivka Baune 05/21/2017, 5:07 PM

## 2017-05-21 NOTE — Progress Notes (Addendum)
Subjective: Postpartum Day 1: Cesarean Delivery Patient reports incisional pain and tolerating PO.  Pain controlled, nl lochia.  Pt with cough.    Objective: Vital signs in last 24 hours: Temp:  [97.5 F (36.4 C)-98.8 F (37.1 C)] 97.7 F (36.5 C) (05/08 0513) Pulse Rate:  [59-114] 72 (05/08 0513) Resp:  [13-19] 19 (05/08 0513) BP: (99-125)/(61-94) 120/73 (05/08 0513) SpO2:  [97 %-100 %] 98 % (05/08 0513)  Physical Exam:  General: alert and no distress Lochia: appropriate Uterine Fundus: firm Incision: healing well DVT Evaluation: No evidence of DVT seen on physical exam.  Recent Labs    05/19/17 0950 05/21/17 0606  HGB 12.2 10.6*  HCT 35.5* 31.3*    Assessment/Plan: Status post Cesarean section. Doing well postoperatively.  Continue current care. Delsym for cough - OK per pharmacy  Elinora Weigand Bovard-Stuckert 05/21/2017, 7:31 AM

## 2017-05-21 NOTE — Progress Notes (Signed)
[name] was referred for history of depression/anxiety. * Referral screened out by Clinical Social Worker because none of the following criteria appear to apply: ~ History of anxiety/depression during this pregnancy, or of post-partum depression. ~ Diagnosis of anxiety and/or depression within last 3 years OR * [name]'s symptoms currently being treated with medication and/or therapy. Please contact the Clinical Social Worker if needs arise, by [facility] request, or if [name] scores greater than 9/yes to question 10 on Edinburgh Postpartum Depression Screen.  [name]'s chart notes "situational depression," which she received counseling and medication management for in the past.  There is no documentation of mental health concerns currently.

## 2017-05-22 NOTE — Lactation Note (Addendum)
This note was copied from a baby's chart. Lactation Consultation Note  Patient Name: Nicole Mcintyre ZOXWR'U Date: 05/22/2017 Reason for consult: Follow-up assessment;Infant weight loss   P2, 9.9% weight loss.  7 voids & 2 stools in the last 24 hours. Baby 51 hours old and sleeping while mother is pumping w/ DEBP. Mom has my # to call for assist w/next feeding. Encouraged to pump both breasts at the same time for efficiency.  Mother called for LC to observe feeding. Mother latched baby in football hold.  Turned baby on it's side and encouraged compression. Intermittent sucks and swallows observed.   Mother has only been breastfeeding on one breast per feeding.  Encouraged feeding on both and then post pump. Demonstrated how to finger syringe feed baby the 10 ml mother recently pumped. Mother will continue to post pump and give volume back to baby.   Maternal Data    Feeding Feeding Type: Breast Fed Length of feed: 15 min  LATCH Score Latch: Repeated attempts needed to sustain latch, nipple held in mouth throughout feeding, stimulation needed to elicit sucking reflex.  Audible Swallowing: Spontaneous and intermittent  Type of Nipple: Everted at rest and after stimulation  Comfort (Breast/Nipple): Filling, red/small blisters or bruises, mild/mod discomfort(discomfort)  Hold (Positioning): Assistance needed to correctly position infant at breast and maintain latch.  LATCH Score: 7  Interventions Interventions: DEBP  Lactation Tools Discussed/Used     Consult Status Consult Status: Follow-up Date: 05/22/17 Follow-up type: In-patient    Dahlia Byes [facility] 05/22/2017, 11:34 AM

## 2017-05-22 NOTE — Progress Notes (Signed)
Subjective: Postpartum Day 2: Cesarean Delivery Patient reports tolerating PO and no problems voiding.  No flatus yet. Working on breastfeeding  Objective: Vital signs in last 24 hours: Temp:  [98.2 F (36.8 C)] 98.2 F (36.8 C) (05/09 0526) Pulse Rate:  [63-70] 63 (05/09 0526) Resp:  [15-20] 15 (05/09 0526) BP: (115-125)/(61-77) 115/61 (05/09 0526) SpO2:  [99 %] 99 % (05/08 1740)  Physical Exam:  General: alert and cooperative Lochia: appropriate Uterine Fundus: firm Incision: C/D/I   Recent Labs    05/19/17 0950 05/21/17 0606  HGB 12.2 10.6*  HCT 35.5* 31.3*    Assessment/Plan: Status post Cesarean section. Doing well postoperatively.  Baby lost 9-10% weight, so will work with lactation  Continue current care, plan d/c tomorrow.  Oliver Pila 05/22/2017, 9:13 AM

## 2017-05-22 NOTE — Progress Notes (Signed)
Pt complaining of right calf that is worse with movement and stretching. Tender to the touch, but feels deeper in the leg per patient. No edema noted, no redness, and leg is cool to the touch. Senaida Ores was still notified. No new orders. Will continue to monitor and observe for any symptoms other than pain. RN encouraged patient to ambulate. Royston Cowper, RN

## 2017-05-23 MED ORDER — IBUPROFEN 600 MG PO TABS: 600.0000 mg | ORAL_TABLET | Freq: Four times a day (QID) | ORAL | 0 refills | Status: DC

## 2017-05-23 MED ORDER — OXYCODONE HCL 5 MG PO TABS: 5.0000 mg | ORAL_TABLET | ORAL | 0 refills | Status: DC | PRN

## 2017-05-23 NOTE — Plan of Care (Signed)
Progressing appropriately. Parents express and demonstrate increasing comfort level with syringe feeding process.

## 2017-05-23 NOTE — Discharge Instructions (Signed)
As per discharge pamphlet °

## 2017-05-23 NOTE — Progress Notes (Signed)
POD #3 Doing well, still with cough, some pain right lateral calf, baby feeding better and weight stable Afeb, VSS Abd- soft, fundus firm, incision intact LE- 1+ bilateral edema, no cords palpated, neg Homan's, lateral right calf tender Will d/c home, no evidence of DVT

## 2017-05-23 NOTE — Discharge Summary (Signed)
OB Discharge Summary     Patient Name: Nicole Mcintyre DOB: [birth year] MRN: [id number]  Date of admission: 05/20/2017 Delivering MD: Huel Cote   Date of discharge: 05/23/2017  Admitting diagnosis: repeat c-section Intrauterine pregnancy: [redacted]w[redacted]d     Secondary diagnosis:  Active Problems:   History of C-section   S/P repeat low transverse C-section      Discharge diagnosis: Term Pregnancy Delivered                                  Hospital course:  Sceduled C/S   37 y.o. yo G2P2002 at [redacted]w[redacted]d was admitted to the hospital 05/20/2017 for scheduled cesarean section with the following indication:Elective Repeat.  Membrane Rupture Time/Date: 8:00 AM ,05/20/2017   Patient delivered a Viable infant.05/20/2017  Details of operation can be found in separate operative note.  Pateint had an uncomplicated postpartum course.  She is ambulating, tolerating a regular diet, passing flatus, and urinating well. Patient is discharged home in stable condition on  05/23/17         Physical exam  Vitals:   05/21/17 1740 05/22/17 0526 05/22/17 1715 05/23/17 0557  BP: 125/77 115/61 94/61 118/70  Pulse: 70 63 74 60  Resp: Temp:  98.2 F (36.8 C) 98.1 F (36.7 C) 97.8 F (36.6 C)  TempSrc:  Oral Oral Oral  SpO2: 99%  100%   Weight:      Height:       General: alert Lochia: appropriate Uterine Fundus: firm Incision: Healing well with no significant drainage DVT Evaluation: No evidence of DVT seen on physical exam. Labs: Lab Results  Component Value Date   WBC 7.4 05/21/2017   HGB 10.6 (L) 05/21/2017   HCT 31.3 (L) 05/21/2017   MCV 95.1 05/21/2017   PLT 133 (L) 05/21/2017   CMP Latest Ref Rng & Units 09/26/2016  Glucose 65 - 99 mg/dL 409(W)  BUN 6 - 20 mg/dL 7  Creatinine 1.19 - 1.47 mg/dL 8.29  Sodium 562 - 130 mmol/L 138  Potassium 3.5 - 5.2 mmol/L 4.2  Chloride 96 - 106 mmol/L 102  CO2 20 - 29 mmol/L 19(L)  Calcium 8.7 - 10.2 mg/dL 9.1  Total Protein 6.0 - 8.5 g/dL 7.6   Total Bilirubin 0.0 - 1.2 mg/dL <8.6  Alkaline Phos 39 - 117 IU/L 56  AST 0 - 40 IU/L 25  ALT 0 - 32 IU/L 28    Discharge instruction: per After Visit Summary and "Baby and Me Booklet".  After visit meds:  Allergies as of 05/23/2017      Reactions   Latex Swelling      Medication List    TAKE these medications   ibuprofen 600 MG tablet Commonly known as:  ADVIL,MOTRIN Take 1 tablet (600 mg total) by mouth every 6 (six) hours.   oxyCODONE 5 MG immediate release tablet Commonly known as:  Oxy IR/ROXICODONE Take 1 tablet (5 mg total) by mouth every 4 (four) hours as needed for severe pain.   PRENATAL/IRON PO Take 1 tablet by mouth at bedtime.       Diet: routine diet  Activity: Advance as tolerated. Pelvic rest for 6 weeks.   Outpatient follow up:2 weeks  Newborn Data: Live born female  Birth Weight: 7 lb 0.2 oz (3180 g) APGAR: 10, 10  Newborn Delivery   Birth date/time:  05/20/2017 08:01:00 Delivery type:  C-Section, Low  Transverse Trial of labor:  No C-section categorization:  Repeat     Baby Feeding: Breast Disposition:home with mother   05/23/2017 Zenaida Niece, MD

## 2017-05-23 NOTE — Lactation Note (Addendum)
This note was copied from a baby's chart. Lactation Consultation Note  Patient Name: Nicole Mcintyre ZOXWR'U Date: 05/23/2017 Reason for consult: Follow-up assessment;Infant weight loss;Term;Nipple pain/trauma(10 % weight losss , milk is in , pumping off 40 ml )  Baby is 73 hours old  LC reviewed and updated the doc flow sheets  Per mom nipples are sore, LC assessed with moms permission and noted the nipple / areola complex dry and small crack.  Baby hungry and awake, diaper dry. LC offered to assist with latch and mom receptive.  LC assisted to latch on the right breast / football/ and showed dad how to assist mom to  Obtain the depth with latch. Initially mom had some discomfort and with breast compressions,  And baby feeding in a active swallowing pattern, per mom comfortable.  Baby fed 12 mins and released, nipple slyly slanted. Encouraged mom to use her EBM on her nipples liberally.  Sore nipple and engorgement prevention and tx reviewed.  LC instructed mom on the use of hand pump for pre-pumping .  And mom will have her DEBP Medela at home.  LC recommended to mom until the baby is consistently obtaining depth to have dad assisting with latch.  LC obtained moms UMR benefit DEBP and gave her instructions.  LC recommended if the weight is slow to increase to call for [facility] O[facility] appt.  Mother informed of post-discharge support and given phone number to the lactation department, including services for phone call assistance; out-patient appointments; and breastfeeding support group. List of other breastfeeding resources in the community given in the handout. Encouraged mother to call for problems or concerns related to breastfeeding.     Maternal Data Has patient been taught Hand Expression?: Yes  Feeding Feeding Type: Breast Fed Length of feed: 12 min(multiple swallows, increased w/ breast compressions )  LATCH Score Latch: Grasps breast easily, tongue down, lips flanged, rhythmical  sucking.  Audible Swallowing: Spontaneous and intermittent  Type of Nipple: Everted at rest and after stimulation  Comfort (Breast/Nipple): Filling, red/small blisters or bruises, mild/mod discomfort  Hold (Positioning): Assistance needed to correctly position infant at breast and maintain latch.  LATCH Score: 8  Interventions Interventions: Breast feeding basics reviewed;Assisted with latch;Skin to skin;Breast massage;Breast compression;Adjust position;Position options;Coconut oil;Comfort gels;Hand pump;DEBP  Lactation Tools Discussed/Used Tools: Pump Shell Type: Inverted Breast pump type: Double-Electric Breast Pump;Manual   Consult Status Consult Status: Follow-up Date: 05/23/17 Follow-up type: In-patient    Matilde Sprang Nashali Ditmer 05/23/2017, 9:49 AM

## 2017-05-27 ENCOUNTER — Other Ambulatory Visit: Payer: Self-pay

## 2017-05-27 ENCOUNTER — Inpatient Hospital Stay (HOSPITAL_COMMUNITY)
Admission: AD | Admit: 2017-05-27 | Discharge: 2017-05-29 | DRG: 776 | Disposition: A | Discharge status: Home / Self Care | Payer: 59 | Source: Ambulatory Visit | Attending: Obstetrics and Gynecology | Admitting: Obstetrics and Gynecology

## 2017-05-27 DIAGNOSIS — Z87891 Personal history of nicotine dependence: Secondary | ICD-10-CM | POA: Diagnosis not present

## 2017-05-27 DIAGNOSIS — R51 Headache: Secondary | ICD-10-CM | POA: Diagnosis present

## 2017-05-27 DIAGNOSIS — O1495 Unspecified pre-eclampsia, complicating the puerperium: Principal | ICD-10-CM | POA: Diagnosis present

## 2017-05-27 DIAGNOSIS — O99215 Obesity complicating the puerperium: Secondary | ICD-10-CM | POA: Diagnosis present

## 2017-05-27 LAB — PROTEIN / CREATININE RATIO, URINE
CREATININE, URINE: 92 mg/dL
Protein Creatinine Ratio: 0.09 mg/mg{Cre} (ref 0.00–0.15)
TOTAL PROTEIN, URINE: 8 mg/dL

## 2017-05-27 LAB — COMPREHENSIVE METABOLIC PANEL
ALT: 42 U/L (ref 14–54)
AST: 28 U/L (ref 15–41)
Albumin: 3.5 g/dL (ref 3.5–5.0)
Alkaline Phosphatase: 71 U/L (ref 38–126)
Anion gap: 13 (ref 5–15)
BILIRUBIN TOTAL: 0.5 mg/dL (ref 0.3–1.2)
BUN: 11 mg/dL (ref 6–20)
CO2: 23 mmol/L (ref 22–32)
CREATININE: 0.99 mg/dL (ref 0.44–1.00)
Calcium: 9.1 mg/dL (ref 8.9–10.3)
Chloride: 104 mmol/L (ref 101–111)
Glucose, Bld: 85 mg/dL (ref 65–99)
Potassium: 3.8 mmol/L (ref 3.5–5.1)
Sodium: 140 mmol/L (ref 135–145)
TOTAL PROTEIN: 8 g/dL (ref 6.5–8.1)

## 2017-05-27 LAB — CBC
HEMATOCRIT: 39.5 % (ref 36.0–46.0)
Hemoglobin: 11.9 g/dL — ABNORMAL LOW (ref 12.0–15.0)
MCH: 32.3 pg (ref 26.0–34.0)
MCHC: 30.1 g/dL (ref 30.0–36.0)
MCV: 107.3 fL — AB (ref 78.0–100.0)
PLATELETS: 220 10*3/uL (ref 150–400)
RBC: 3.68 MIL/uL — AB (ref 3.87–5.11)
RDW: 14.4 % (ref 11.5–15.5)
WBC: 5.4 10*3/uL (ref 4.0–10.5)

## 2017-05-27 MED ORDER — PRENATAL MULTIVITAMIN CH
1.0000 | ORAL_TABLET | Freq: Every day | ORAL | Status: DC
  Administered 2017-05-28 – 2017-05-29 (×2): 1 via ORAL
  Filled 2017-05-27 (×2): qty 1

## 2017-05-27 MED ORDER — LABETALOL HCL 5 MG/ML IV SOLN
20.0000 mg | INTRAVENOUS | Status: DC | PRN
  Administered 2017-05-27: 20 mg via INTRAVENOUS
  Administered 2017-05-27: 40 mg via INTRAVENOUS
  Filled 2017-05-27: qty 4
  Filled 2017-05-27: qty 8

## 2017-05-27 MED ORDER — BUTALBITAL-APAP-CAFFEINE 50-325-40 MG PO TABS
2.0000 | ORAL_TABLET | Freq: Once | ORAL | Status: AC
  Administered 2017-05-27: 2 via ORAL
  Filled 2017-05-27: qty 2

## 2017-05-27 MED ORDER — MAGNESIUM SULFATE 40 G IN LACTATED RINGERS - SIMPLE
1.0000 g/h | INTRAVENOUS | Status: AC
  Administered 2017-05-28: 1 g/h via INTRAVENOUS
  Filled 2017-05-27: qty 500

## 2017-05-27 MED ORDER — LACTATED RINGERS IV SOLN
INTRAVENOUS | Status: DC
  Administered 2017-05-28: 14:00:00 via INTRAVENOUS
  Administered 2017-05-28: 75 mL/h via INTRAVENOUS

## 2017-05-27 MED ORDER — HYDRALAZINE HCL 20 MG/ML IJ SOLN: 10.0000 mg | Freq: Once | INTRAMUSCULAR | Status: DC | PRN

## 2017-05-27 MED ORDER — LACTATED RINGERS IV SOLN
INTRAVENOUS | Status: DC
  Administered 2017-05-27: 22:00:00 via INTRAVENOUS

## 2017-05-27 MED ORDER — MAGNESIUM SULFATE BOLUS VIA INFUSION
4.0000 g | Freq: Once | INTRAVENOUS | Status: AC
  Administered 2017-05-28: 4 g via INTRAVENOUS
  Filled 2017-05-27: qty 500

## 2017-05-27 NOTE — MAU Note (Signed)
PT SAYS SHE  C/S  ON 05-20-2017- BY   DR Senaida Ores.     - -  D/C HOME ON Friday-  ALL OK.   THEN ON Saturday- STARTED  H/A.   TOOK 600 MG IBUPROFEN  - LAST TIME AT  2PM  TODAY - NO RELIEF.    TOOK  OXYCODONE -  LAST YESTERDAY.  .    NOW FEELS PAIN BEHIND LEFT EYE  THEN GOES  BEHIND INTO HER NECK   .    ALSO FEET ARE SWOLLEN.      BEAST FEEDING - H/A WORSE AFTER FEEDING

## 2017-05-27 NOTE — MAU Provider Note (Addendum)
Patient Nicole Mcintyre is a 37 y.o. [phone] Who is 7 days postpartum here with complaints of headache for 3 days. She also has some swelling in her legs. She has an elective repeat C-section on 05-20-2017. She denies history of blood pressure issues in this pregnancy.   History     CSN: [id number]  Arrival date and time: 05/27/17 2956   First Provider Initiated Contact with Patient 05/27/17 2018      Chief Complaint  Patient presents with  . Headache   Headache   This is a new problem. The current episode started in the past 7 days. The problem occurs intermittently. The pain is at a severity of 10/10 (right now its an 8/10. ). Pertinent negatives include no blurred vision or nausea. Associated symptoms comments: The headache makes her nauseated. .  She has a history of migraines, but this feels much worse than that. The HA is made worse by breastfeeding and pumping.  She has tried ibuprofen and oxycodone for her headache and it hasn't helped.  OB History    Gravida  2   Para  2   Term  2   Preterm      AB      Living  2     SAB      TAB      Ectopic      Multiple  0   Live Births  2           Past Medical History:  Diagnosis Date  . AMA (advanced maternal age) multigravida 35+   . Anemia   . Chicken pox   . Cystitis   . Depression   . Diverticulitis   . History of fainting spells of unknown cause   . Migraines   . Shingles   . Thyroid disease     Past Surgical History:  Procedure Laterality Date  . APPENDECTOMY    . CESAREAN SECTION    . CESAREAN SECTION N/A 05/20/2017   Procedure: REPEAT CESAREAN SECTION;  Surgeon: Huel Cote, MD;  Location: [facility] BIRTHING SUITES;  Service: Obstetrics;  Laterality: N/A;  MD request EXTRA 30 Mins and an RNFA- Tracey  . RHINOPLASTY      Family History  Problem Relation Age of Onset  . Alcohol abuse Father   . Diabetes Father   . Arthritis Maternal Grandmother   . Breast cancer Maternal Grandmother   . Alcohol  abuse Maternal Grandfather   . Diabetes Paternal Grandmother   . Healthy Mother   . Anemia Mother   . Healthy Paternal Grandfather     Social History   Tobacco Use  . Smoking status: Former Games developer  . Smokeless tobacco: Never Used  Substance Use Topics  . Alcohol use: Not Currently    Alcohol/week: 0.6 oz    Types: 1 Glasses of wine per week  . Drug use: No    Allergies:  Allergies  Allergen Reactions  . Latex Swelling    Medications Prior to Admission  Medication Sig Dispense Refill Last Dose  . ibuprofen (ADVIL,MOTRIN) 600 MG tablet Take 1 tablet (600 mg total) by mouth every 6 (six) hours. 30 tablet 0   . oxyCODONE (OXY IR/ROXICODONE) 5 MG immediate release tablet Take 1 tablet (5 mg total) by mouth every 4 (four) hours as needed for severe pain. 15 tablet 0   . Prenatal Multivit-Min-Fe-FA (PRENATAL/IRON PO) Take 1 tablet by mouth at bedtime.       Review of Systems  Eyes:  Negative for blurred vision.  Respiratory: Negative.   Cardiovascular: Negative.   Gastrointestinal: Negative for nausea.  Genitourinary: Negative.   Musculoskeletal: Negative.   Neurological: Positive for headaches.  Psychiatric/Behavioral: Negative.    Physical Exam   Blood pressure (!) 164/90, pulse 67, temperature 98.3 F (36.8 C), temperature source Oral, resp. rate 20, height 5' 4.25" (1.632 m), weight 252 lb 4 oz (114.4 kg), unknown if currently breastfeeding.  Patient Vitals for the past 24 hrs:  BP Temp Temp src Pulse Resp Height Weight  05/27/17 2245 (!) 164/90 - - 67 - - -  05/27/17 2230 (!) 163/84 - - 67 - - -  05/27/17 2210 (!) 163/83 - - 60 - - -  05/27/17 2145 (!) 158/84 - - (!) 59 - - -  05/27/17 2120 (!) (P) 161/75 - - (!) 57 - - -  05/27/17 2100 (!) 165/89 - - (!) 57 - - -  05/27/17 2000 (!) 152/85 - - 62 - - -  05/27/17 1922 127/70 98.3 F (36.8 C) Oral 66 20 5' 4.25" (1.632 m) 252 lb 4 oz (114.4 kg)    Physical Exam  Constitutional: She is oriented to person, place,  and time. She appears well-developed.  HENT:  Head: Normocephalic.  Neck: Normal range of motion.  Respiratory: Effort normal.  GI: Soft.  incision is clean, dry with steristrips in place; no drainage, redness or tenderness.   Musculoskeletal: Normal range of motion.  Neurological: She is alert and oriented to person, place, and time.  Skin: Skin is warm.    MAU Course  Procedures  MDM -Fioricet -LR infusion -CBC, CMP, PCR  Plan of care endorsed to Judeth Horn at 2121.   Assessment and Plan    Charlesetta Garibaldi [facility] 05/27/2017, 8:19 PM   Headache no better s/p fioricet IV antihypertensive protocol ordered for severe range BPs. Not improved and patient being given 2nd dose of labetalol (  then 40 mg) Labs WNL, other than serum creatinine elevated to 0.99 C/w Dr. Mindi Slicker. Notified of presentation, labs, BPs, and treatment. Will admit for mag sulfate.   A:  1. Preeclampsia in postpartum period    P: Admit Mag sulfate 4g bolus followed by 1 gm per hour Continue IV labetalol PRN severe range BPs  Judeth Horn, NP

## 2017-05-28 ENCOUNTER — Other Ambulatory Visit: Payer: Self-pay

## 2017-05-28 ENCOUNTER — Encounter (HOSPITAL_COMMUNITY): Payer: Self-pay | Admitting: *Deleted

## 2017-05-28 MED ORDER — OXYCODONE-ACETAMINOPHEN 5-325 MG PO TABS
1.0000 | ORAL_TABLET | Freq: Four times a day (QID) | ORAL | Status: DC | PRN
  Administered 2017-05-28 – 2017-05-29 (×4): 1 via ORAL
  Filled 2017-05-28 (×4): qty 1

## 2017-05-28 MED ORDER — LABETALOL HCL 100 MG PO TABS
100.0000 mg | ORAL_TABLET | Freq: Two times a day (BID) | ORAL | Status: DC
  Administered 2017-05-28 (×3): 100 mg via ORAL
  Filled 2017-05-28 (×3): qty 1

## 2017-05-28 NOTE — Progress Notes (Signed)
Patient ID: Nicole Mcintyre, female   DOB: 05-28-1980, 37 y.o.   MRN: [id number] Pt admitted with elevated BP postpartum and severe HA.  Headache initially improved on magnesium, but has noted again this AM though not as severe. BP improved from admission 120-150/70-80's on magnesium and labetalol 100 mg po BID UOP /last shift Abdomen soft NT Incision clear, no erythema--steristrips in place  Will continue magnesium until this PM and then reassess. Encouraged pt to try pain med for HA.

## 2017-05-28 NOTE — Progress Notes (Addendum)
Before Magnesium bolus initiated 164/96.

## 2017-05-28 NOTE — Progress Notes (Signed)
Patient ID: Nicole Mcintyre, female   DOB: [birth year], 37 y.o.   MRN: [id number] HD 1/2 elevated blood pressures postpartum  Pt felt better this AM and HA resolved with sleep and pain medication, returning this PM but not as severe  afeb BP controlled on magnesium and labetalol  po BID Beginning to diurese well UOP 3040mL/shift   Will d/c magnesium this PM and follow BP and symptoms Hopefully if BP stays controlled and HA resolves can d/c in AM

## 2017-05-28 NOTE — H&P (Addendum)
Nicole Mcintyre is an 37 y.o. G58P2002 female presenting one week post repeat c/s with complaint of persistent HA. Pt noted to have severe range BPs in MAu ranging from 150-170s/90-100s. Pt had normal BPs prior to pregnancy and all through pregnancy ( 110s-130/70s). Negative LFTs in upper normal range; u/p ratio 0.09. She admits has not slept much in past few days. Oxycodone, fioricet and ibuprofen did not help HA at home.   Pertinent Gynecological History: OB History: G2, P2002   Menstrual History: Menarche age: 83 No LMP recorded.    Past Medical History:  Diagnosis Date  . AMA (advanced maternal age) multigravida 35+   . Anemia   . Chicken pox   . Cystitis   . Depression   . Diverticulitis   . History of fainting spells of unknown cause   . Migraines   . Shingles   . Thyroid disease     Past Surgical History:  Procedure Laterality Date  . APPENDECTOMY    . CESAREAN SECTION    . CESAREAN SECTION N/A 05/20/2017   Procedure: REPEAT CESAREAN SECTION;  Surgeon: Huel Cote, MD;  Location: [facility] BIRTHING SUITES;  Service: Obstetrics;  Laterality: N/A;  MD request EXTRA 30 Mins and an RNFA- Tracey  . RHINOPLASTY      Family History  Problem Relation Age of Onset  . Alcohol abuse Father   . Diabetes Father   . Arthritis Maternal Grandmother   . Breast cancer Maternal Grandmother   . Alcohol abuse Maternal Grandfather   . Diabetes Paternal Grandmother   . Healthy Mother   . Anemia Mother   . Healthy Paternal Grandfather     Social History:  reports that she has quit smoking. She has never used smokeless tobacco. She reports that she drank about 0.6 oz of alcohol per week. She reports that she does not use drugs.  Allergies:  Allergies  Allergen Reactions  . Latex Swelling    Medications Prior to Admission  Medication Sig Dispense Refill Last Dose  . ibuprofen (ADVIL,MOTRIN) 600 MG tablet Take 1 tablet (600 mg total) by mouth every 6 (six) hours. 30 tablet 0   .  oxyCODONE (OXY IR/ROXICODONE) 5 MG immediate release tablet Take 1 tablet (5 mg total) by mouth every 4 (four) hours as needed for severe pain. 15 tablet 0   . Prenatal Multivit-Min-Fe-FA (PRENATAL/IRON PO) Take 1 tablet by mouth at bedtime.       Review of Systems  Constitutional: Positive for malaise/fatigue. Negative for chills, fever and weight loss.  HENT: Negative for tinnitus.   Eyes: Positive for photophobia. Negative for blurred vision and double vision.  Respiratory: Negative for shortness of breath.   Cardiovascular: Positive for leg swelling. Negative for chest pain.  Gastrointestinal: Negative for abdominal pain, heartburn, nausea and vomiting.  Genitourinary: Negative for dysuria and urgency.  Musculoskeletal: Negative for back pain, myalgias and neck pain.  Skin: Negative for itching and rash.  Neurological: Positive for headaches. Negative for dizziness and focal weakness.  Endo/Heme/Allergies: Does not bruise/bleed easily.  Psychiatric/Behavioral: Negative for depression, hallucinations, substance abuse and suicidal ideas. The patient has insomnia.     Blood pressure 128/71, pulse 77, temperature 98.1 F (36.7 C), temperature source Oral, resp. rate 18, height 5' 4.25" (1.632 m), weight 252 lb 4 oz (114.4 kg), SpO2 97 %, unknown if currently breastfeeding. Physical Exam  Constitutional: She is oriented to person, place, and time. She appears well-developed and well-nourished.  HENT:  Head: Normocephalic.  Neck: Normal  range of motion.  Cardiovascular: Normal rate.  Respiratory: Effort normal.  GI: Soft.  Musculoskeletal: Normal range of motion. She exhibits edema.  Neurological: She is alert and oriented to person, place, and time.  Skin: Skin is warm.  Psychiatric: She has a normal mood and affect. Her behavior is normal. Judgment and thought content normal.    Results for orders placed or performed during the hospital encounter of 05/27/17 (from the past 24  hour(s))  CBC     Status: Abnormal   Collection Time: 05/27/17  9:35 PM  Result Value Ref Range   WBC 5.4 4.0 - 10.5 K/uL   RBC 3.68 (L) 3.87 - 5.11 MIL/uL   Hemoglobin 11.9 (L) 12.0 - 15.0 g/dL   HCT 16.1 09.6 - 04.5 %   MCV 107.3 (H) 78.0 - 100.0 fL   MCH 32.3 26.0 - 34.0 pg   MCHC 30.1 30.0 - 36.0 g/dL   RDW 40.9 81.1 - 91.4 %   Platelets 220 150 - 400 K/uL  Comprehensive metabolic panel     Status: None   Collection Time: 05/27/17  9:35 PM  Result Value Ref Range   Sodium 140 135 - 145 mmol/L   Potassium 3.8 3.5 - 5.1 mmol/L   Chloride 104 101 - 111 mmol/L   CO2 23 22 - 32 mmol/L   Glucose, Bld 85 65 - 99 mg/dL   BUN 11 6 - 20 mg/dL   Creatinine, Ser 7.82 0.44 - 1.00 mg/dL   Calcium 9.1 8.9 - 95.6 mg/dL   Total Protein 8.0 6.5 - 8.1 g/dL   Albumin 3.5 3.5 - 5.0 g/dL   AST 28 15 - 41 U/L   ALT 42 14 - 54 U/L   Alkaline Phosphatase 71 38 - 126 U/L   Total Bilirubin 0.5 0.3 - 1.2 mg/dL   GFR calc non Af Amer >60 >60 mL/min   GFR calc Af Amer >60 >60 mL/min   Anion gap 13 5 - 15  Protein / creatinine ratio, urine     Status: None   Collection Time: 05/27/17 10:06 PM  Result Value Ref Range   Creatinine, Urine 92.00 mg/dL   Total Protein, Urine 8 mg/dL   Protein Creatinine Ratio 0.09 0.00 - 0.15 mg/mg[Cre]    No results found.  Assessment/Plan: [id number] on day 7 s/p repeat cesarean section for pregnancy complicated only by obesity and prior c/s incision with preeclampsia per new onset severe range BP  Pt admitted for BP mgmt and seizure prophylaxis MgSO4 started - pt counseled to risks/benefits- continue for 24hrs  Will start on labetalol  po bid and adjust as needed Encourage rest/sleep- mother present to help with care of baby Percocet for pain control   Nicole Mcintyre 05/28/2017, 3:21 AM

## 2017-05-28 NOTE — Lactation Note (Signed)
Lactation Consultation Note  Patient Name: Nicole Mcintyre UXLKG'M Date: 05/28/2017   Follow up with mom who was readmitted for BP. Mom is pumping and getting good volumes of breast milk. Infant is in the room with the infant. Mom reports she has all she needs for pumping and feeding. Mom aware she can call for Lactation assistance as needed. Mom with no questions/concerns at this time.      Maternal Data    Feeding    LATCH Score                   Interventions    Lactation Tools Discussed/Used     Consult Status      Ed Blalock 05/28/2017, 1:07 PM

## 2017-05-28 NOTE — Progress Notes (Addendum)
After completion of Magnesium bolus 142/91

## 2017-05-29 LAB — COMPREHENSIVE METABOLIC PANEL
ALBUMIN: 3.2 g/dL — AB (ref 3.5–5.0)
ALK PHOS: 66 U/L (ref 38–126)
ALT: 34 U/L (ref 14–54)
ANION GAP: 9 (ref 5–15)
AST: 26 U/L (ref 15–41)
BUN: 7 mg/dL (ref 6–20)
CALCIUM: 8.4 mg/dL — AB (ref 8.9–10.3)
CHLORIDE: 106 mmol/L (ref 101–111)
CO2: 26 mmol/L (ref 22–32)
Creatinine, Ser: 1.13 mg/dL — ABNORMAL HIGH (ref 0.44–1.00)
GFR calc non Af Amer: >60 mL/min (ref >60)
GLUCOSE: 89 mg/dL (ref 65–99)
Potassium: 3.9 mmol/L (ref 3.5–5.1)
SODIUM: 141 mmol/L (ref 135–145)
Total Bilirubin: 0.2 mg/dL — ABNORMAL LOW (ref 0.3–1.2)
Total Protein: 7.1 g/dL (ref 6.5–8.1)

## 2017-05-29 LAB — CBC
HCT: 33.1 % — ABNORMAL LOW (ref 36.0–46.0)
HEMOGLOBIN: 11.1 g/dL — AB (ref 12.0–15.0)
MCH: 32.1 pg (ref 26.0–34.0)
MCHC: 33.5 g/dL (ref 30.0–36.0)
MCV: 95.7 fL (ref 78.0–100.0)
PLATELETS: 252 10*3/uL (ref 150–400)
RBC: 3.46 MIL/uL — AB (ref 3.87–5.11)
RDW: 14 % (ref 11.5–15.5)
WBC: 5.1 10*3/uL (ref 4.0–10.5)

## 2017-05-29 MED ORDER — LABETALOL HCL 200 MG PO TABS
200.0000 mg | ORAL_TABLET | Freq: Two times a day (BID) | ORAL | Status: DC
  Administered 2017-05-29: 200 mg via ORAL
  Filled 2017-05-29: qty 1

## 2017-05-29 MED ORDER — LABETALOL HCL 200 MG PO TABS: 200.0000 mg | ORAL_TABLET | Freq: Two times a day (BID) | ORAL | 1 refills | Status: DC

## 2017-05-29 NOTE — Progress Notes (Signed)
HD #2, POD #8 LTCS, HTN Feeling better this morning, still has a mild headache Afeb, VSS, BP labile, 162/99 this morning Adequate UOP-diuresis Adb- soft, fundus firm, incision intact Magnesium was d/c'ed last pm, feeling better but still with mild headache, BP still a bit elevated.  Will increase labetalol to 200 mg bid, monitor BP and symptoms today

## 2017-05-29 NOTE — Progress Notes (Signed)
Teaching complete  Out in wheelchair with MOM and BABY

## 2017-05-29 NOTE — Discharge Instructions (Signed)
Call for increasing headache

## 2017-05-30 NOTE — Discharge Summary (Signed)
Physician Discharge Summary  Patient ID: Nicole Mcintyre MRN: [id number] DOB/AGE: Aug 27, 1980 37 y.o.  Admit date: 05/27/2017 Discharge date: 05/29/2017  Admission Diagnoses:  Postpartum preeclampsia  Discharge Diagnoses:  Same Active Problems:   Preeclampsia in postpartum period   Discharged Condition: good  Hospital Course: Pt admitted with severe range BP, headache, normal labs, treated with labetalol and magnesium sulfate. Magnesium was discontinued after around 24 hours with good diuresis and improvement in headache.  BP controlled with labetalol 200 mg bid, stable for discharge home afternoon of HD #2  Discharge Exam: Blood pressure 133/80, pulse 70, temperature 98.1 F (36.7 C), temperature source Oral, resp. rate 18, height 5' 4.25" (1.632 m), weight 114.4 kg (252 lb 4 oz), SpO2 100 %, unknown if currently breastfeeding. General appearance: alert  Disposition:   Discharge Instructions    Diet - low sodium heart healthy   Complete by:  As directed    Lifting restrictions   Complete by:  As directed    Weight restriction of 10 lbs.     Allergies as of 05/29/2017      Reactions   Latex Swelling      Medication List    TAKE these medications   ibuprofen 600 MG tablet Commonly known as:  ADVIL,MOTRIN Take 1 tablet (600 mg total) by mouth every 6 (six) hours.   labetalol 200 MG tablet Commonly known as:  NORMODYNE Take 1 tablet (200 mg total) by mouth 2 (two) times daily.   oxyCODONE 5 MG immediate release tablet Commonly known as:  Oxy IR/ROXICODONE Take 1 tablet (5 mg total) by mouth every 4 (four) hours as needed for severe pain.   PRENATAL/IRON PO Take 1 tablet by mouth at bedtime.      Follow-up Information    Huel Cote, MD. Schedule an appointment as soon as possible for a visit in 4 day(s).   Specialty:  Obstetrics and Gynecology Why:  for BP check Contact information: [address] [place] [address] [phone]            Signed: Leighton Roach Shamiracle Gorden 05/30/2017, 12:12 AM

## 2017-06-18 DIAGNOSIS — O1493 Unspecified pre-eclampsia, third trimester: Secondary | ICD-10-CM | POA: Diagnosis not present

## 2017-06-18 DIAGNOSIS — R51 Headache: Secondary | ICD-10-CM | POA: Diagnosis not present

## 2017-06-30 DIAGNOSIS — Z1389 Encounter for screening for other disorder: Secondary | ICD-10-CM | POA: Diagnosis not present

## 2017-06-30 DIAGNOSIS — Z3009 Encounter for other general counseling and advice on contraception: Secondary | ICD-10-CM | POA: Diagnosis not present

## 2017-07-15 DIAGNOSIS — Z30430 Encounter for insertion of intrauterine contraceptive device: Secondary | ICD-10-CM | POA: Diagnosis not present

## 2017-07-30 DIAGNOSIS — O139 Gestational [pregnancy-induced] hypertension without significant proteinuria, unspecified trimester: Secondary | ICD-10-CM | POA: Diagnosis not present

## 2017-07-30 DIAGNOSIS — Z30431 Encounter for routine checking of intrauterine contraceptive device: Secondary | ICD-10-CM | POA: Diagnosis not present

## 2017-07-30 DIAGNOSIS — R51 Headache: Secondary | ICD-10-CM | POA: Diagnosis not present

## 2018-02-24 IMAGING — CR DG CHEST 2V
2 series · 2 of 2 positions shown · non-contrast
Comparison: None in PACs

CLINICAL DATA: Midsternal chest pain radiating to the back since
yesterday afternoon with a pleuritic component; former smoker.

EXAM:
CHEST  2 VIEW

[chest pa]
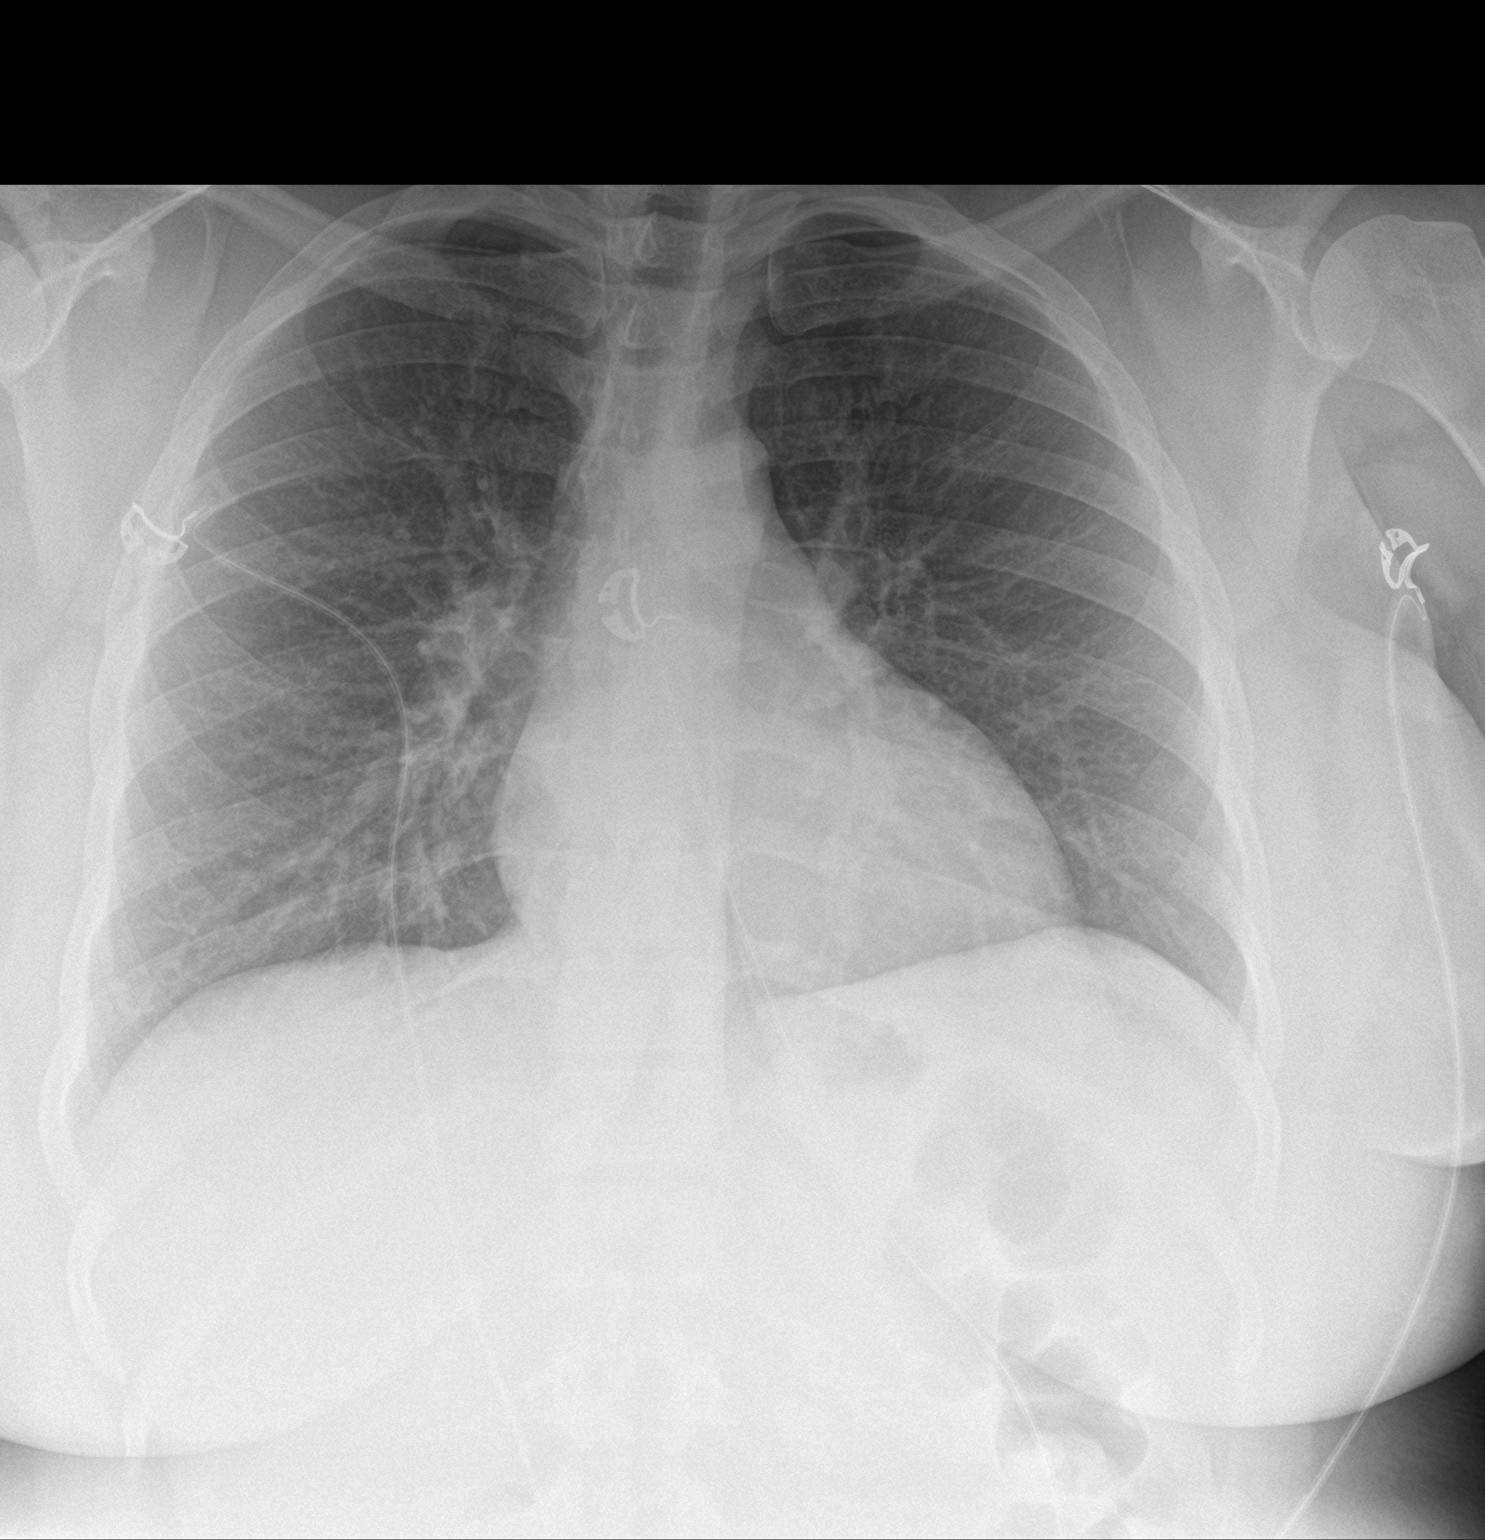

[chest lat]
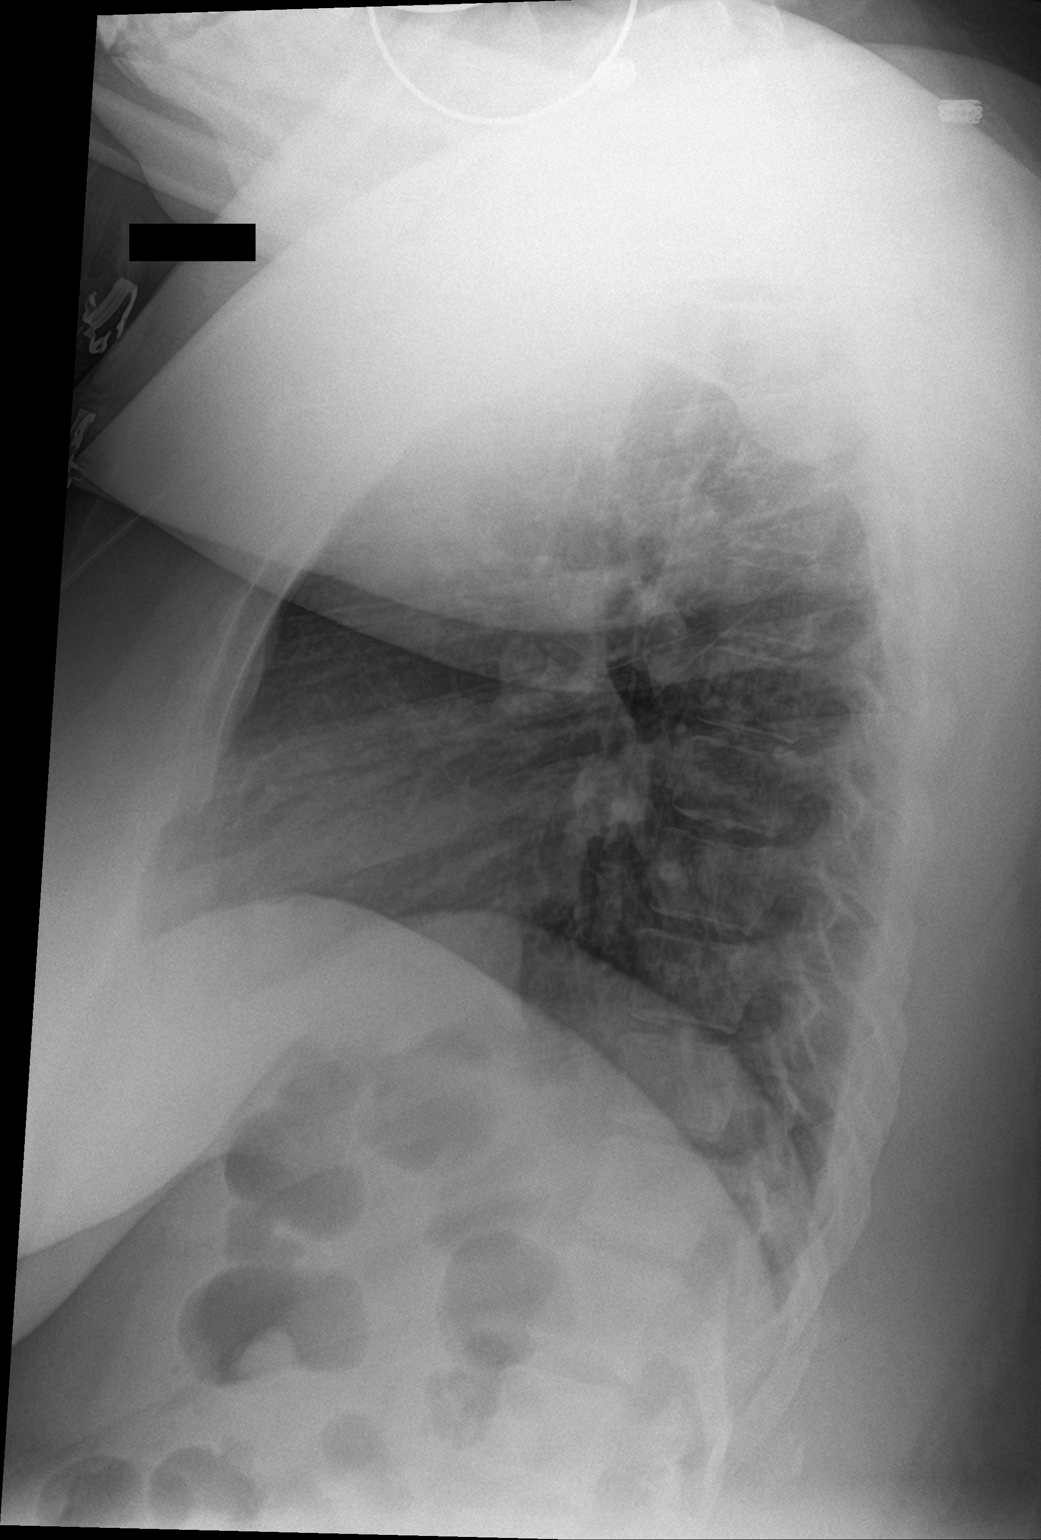

[2 of 2 positions shown; findings below may reference images not displayed]

FINDINGS: The lungs are adequately inflated and clear. The heart and pulmonary
vascularity are normal. The mediastinum is normal in width. There is
no pleural effusion. The bony thorax exhibits no acute abnormality.
IMPRESSION: There is no active cardiopulmonary disease.

## 2018-06-22 DIAGNOSIS — Z30431 Encounter for routine checking of intrauterine contraceptive device: Secondary | ICD-10-CM | POA: Diagnosis not present

## 2018-06-22 DIAGNOSIS — Z1389 Encounter for screening for other disorder: Secondary | ICD-10-CM | POA: Diagnosis not present

## 2018-06-22 DIAGNOSIS — Z113 Encounter for screening for infections with a predominantly sexual mode of transmission: Secondary | ICD-10-CM | POA: Diagnosis not present

## 2018-06-22 DIAGNOSIS — Z6841 Body Mass Index (BMI) 40.0 and over, adult: Secondary | ICD-10-CM | POA: Diagnosis not present

## 2018-06-22 DIAGNOSIS — Z01419 Encounter for gynecological examination (general) (routine) without abnormal findings: Secondary | ICD-10-CM | POA: Diagnosis not present

## 2018-07-02 DIAGNOSIS — Z30430 Encounter for insertion of intrauterine contraceptive device: Secondary | ICD-10-CM | POA: Diagnosis not present

## 2018-07-02 DIAGNOSIS — Z30432 Encounter for removal of intrauterine contraceptive device: Secondary | ICD-10-CM | POA: Diagnosis not present

## 2018-08-10 DIAGNOSIS — Z30431 Encounter for routine checking of intrauterine contraceptive device: Secondary | ICD-10-CM | POA: Diagnosis not present

## 2019-06-14 ENCOUNTER — Telehealth: Payer: 59 | Admitting: Physician Assistant

## 2019-06-14 DIAGNOSIS — R05 Cough: Secondary | ICD-10-CM | POA: Diagnosis not present

## 2019-06-14 DIAGNOSIS — Z1152 Encounter for screening for COVID-19: Secondary | ICD-10-CM | POA: Diagnosis not present

## 2019-06-14 DIAGNOSIS — J309 Allergic rhinitis, unspecified: Secondary | ICD-10-CM

## 2019-06-14 DIAGNOSIS — R059 Cough, unspecified: Secondary | ICD-10-CM

## 2019-06-14 MED ORDER — PROMETHAZINE-DM 6.25-15 MG/5ML PO SYRP: 5.0000 mL | ORAL_SOLUTION | Freq: Four times a day (QID) | ORAL | 0 refills | Status: DC | PRN

## 2019-06-14 MED ORDER — FLUTICASONE PROPIONATE 50 MCG/ACT NA SUSP: 2.0000 | Freq: Every day | NASAL | 6 refills | Status: DC

## 2019-06-14 MED ORDER — BENZONATATE 100 MG PO CAPS: 100.0000 mg | ORAL_CAPSULE | Freq: Three times a day (TID) | ORAL | 0 refills | Status: DC | PRN

## 2019-06-14 NOTE — Progress Notes (Addendum)
Hi Nicole Mcintyre,   You are right, your exposure risk is possibly very low. However, without you having been vaccinated at this time, it is difficult to rule out COVID-19 without a test.  I would consider being tested before considering other diagnoses.    With that being said, based on what you have shared with me, it sounds like you may have Allergic Rhinitis.  Rhinitis is when a reaction occurs that causes nasal congestion, runny nose, sneezing, cough, and itching.  Most types of rhinitis are caused by an inflammation and are associated with symptoms in the eyes ears or throat. There are several types of rhinitis.  The most common are acute rhinitis, which is usually caused by a viral illness, allergic or seasonal rhinitis, and nonallergic or year-round rhinitis.  Nasal allergies occur certain times of the year.  Allergic rhinitis is caused when allergens in the air trigger the release of histamine in the body.  Histamine causes itching, swelling, and fluid to build up in the fragile linings of the nasal passages, sinuses and eyelids.  An itchy nose and clear discharge are common.  I recommend the following over the counter treatments: You should take a daily dose of antihistamine and Xyzal 5 mg take 1 tablet daily  I also would recommend a nasal spray: Flonase 2 sprays into each nostril once daily   HOME CARE:   You can use an over-the-counter saline nasal spray as needed  Avoid areas where there is heavy dust, mites, or molds  Stay indoors on windy days during the pollen season  Keep windows closed in home, at least in bedroom; use air conditioner.  Use high-efficiency house air filter  Keep windows closed in car, turn AC on re-circulate  Avoid playing out with dog during pollen season     Your current symptoms could be consistent with the coronavirus.  Many health care providers can now test patients at their office but not all are.  Bureau has multiple testing sites. For  information on our COVID testing locations and hours go to [email or web address]  We are enrolling you in our MyChart Home Monitoring for COVID19 . Daily you will receive a questionnaire within the MyChart website. Our COVID 19 response team will be monitoring your responses daily.  Testing Information: The COVID-19 Community Testing sites will begin testing BY APPOINTMENT ONLY.  You can schedule online at [email or web address]  If you do not have access to a smart phone or computer you may call [phone] for an appointment.   Additional testing sites in the Community:  . For [facility] Testing sites in [facility]  FarmerBuys.com.au  . For Pop-up testing sites in [place]  [email or web address]-[email or web address].[email or web address]  . For Testing sites with regular hours https://onsms.org//  . For [facility] [email or web address].gonzalez.[email or web address]  . For [facility] EternalVitamin.dk  . For [facility] testing in [place] and [employer] EternalVitamin.dk  . For [facility] testing in [facility]   [email or web address].[email or web address]  For  more information about community testing call 6098218856   Please quarantine yourself while awaiting your test results. Please stay home for a minimum of 10 days from the first day of illness with improving symptoms and you have had 24 hours of no fever (without the use of Tylenol (Acetaminophen) Motrin (Ibuprofen) or any fever reducing medication).  Also - Do not get tested prior to returning to work because once you have had a positive test the test can stay positive for more then a month  in some cases.   You should wear  a mask or cloth face covering over your nose and mouth if you must be around other people or animals, including pets (even at home). Try to stay at least 6 feet away from other people. This will protect the people around you.  Please continue good preventive care measures, including:  frequent hand-washing, avoid touching your face, cover coughs/sneezes, stay out of crowds and keep a 6 foot distance from others.  COVID-19 is a respiratory illness with symptoms that are similar to the flu. Symptoms are typically mild to moderate, but there have been cases of severe illness and death due to the virus.   The following symptoms may appear 2-14 days after exposure: . Fever . Cough . Shortness of breath or difficulty breathing . Chills . Repeated shaking with chills . Muscle pain . Headache . Sore throat . New loss of taste or smell . Fatigue . Congestion or runny nose . Nausea or vomiting . Diarrhea  Go to the nearest hospital ED for assessment if fever/cough/breathlessness are severe or illness seems like a threat to life.  It is vitally important that if you feel that you have an infection such as this virus or any other virus that you stay home and away from places where you may spread it to others.  You should avoid contact with people age 31 and older.   You can use medication such as A prescription cough medication called Tessalon Perles 100 mg. You may take 1-2 capsules every 8 hours as needed for cough and A prescription cough medication called Phenergan DM 6.25 mg/15 mg. You make take one teaspoon / 5 ml every 4-6 hours as needed for cough  You may also use Flonase for your runny nose and sneezing.  Reduce your risk of any infection by using the same precautions used for avoiding the common cold or flu:  Marland Kitchen Wash your hands often with soap and warm water for at least 20 seconds.  If soap and water are not readily available, use an alcohol-based hand sanitizer with at least 60% alcohol.   . If coughing or sneezing, cover your mouth and nose by coughing or sneezing into the elbow areas of your shirt or coat, into a tissue or into your sleeve (not your hands). . Avoid shaking hands with others and consider head nods or verbal greetings only. . Avoid touching your eyes, nose, or mouth with unwashed hands.  . Avoid close contact with people who are sick. . Avoid places or events with large numbers of people in one location, like concerts or sporting events. . Carefully consider travel plans you have or are making. . If you are planning any travel outside or inside the [place], visit the CDC's Travelers' Health webpage for the latest health notices. . If you have some symptoms but not all symptoms, continue to monitor at home and seek medical attention if your symptoms worsen. . If you are having a medical emergency, call 911.  HOME CARE . Only take medications as instructed by your medical team. . Drink plenty of fluids and get plenty of rest. . A steam or ultrasonic humidifier can help if you have congestion.   GET HELP RIGHT AWAY IF YOU HAVE EMERGENCY WARNING SIGNS** FOR COVID-19. If you or someone is showing any of these signs seek emergency medical care immediately. Call 911 or proceed to your closest emergency facility if: . You develop worsening high fever. . Trouble breathing .  Bluish lips or face . Persistent pain or pressure in the chest . New confusion . Inability to wake or stay awake . You cough up blood. . Your symptoms become more severe  **This list is not all possible symptoms. Contact your medical provider for any symptoms that are sever or concerning to you.  MAKE SURE YOU   Understand these instructions.  Will watch your condition.  Will get help right away if you are not doing well or get worse.  Your e-visit answers were reviewed by a board certified advanced clinical practitioner to complete your personal care plan.  Depending on the condition, your  plan could have included both over the counter or prescription medications.  If there is a problem please reply once you have received a response from your provider.  Your safety is important to [place].  If you have drug allergies check your prescription carefully.    You can use MyChart to ask questions about today's visit, request a non-urgent call back, or ask for a work or school excuse for 24 hours related to this e-Visit. If it has been greater than 24 hours you will need to follow up with your provider, or enter a new e-Visit to address those concerns. You will get an e-mail in the next two days asking about your experience.  I hope that your e-visit has been valuable and will speed your recovery. Thank you for using e-visits.   Greater than 5 minutes, yet less than 10 minutes of time have been spent researching, coordinating and implementing care for this patient today.

## 2019-07-02 DIAGNOSIS — Z6841 Body Mass Index (BMI) 40.0 and over, adult: Secondary | ICD-10-CM | POA: Diagnosis not present

## 2019-07-02 DIAGNOSIS — Z130 Encounter for screening for diseases of the blood and blood-forming organs and certain disorders involving the immune mechanism: Secondary | ICD-10-CM | POA: Diagnosis not present

## 2019-07-02 DIAGNOSIS — R87610 Atypical squamous cells of undetermined significance on cytologic smear of cervix (ASC-US): Secondary | ICD-10-CM | POA: Diagnosis not present

## 2019-07-02 DIAGNOSIS — Z124 Encounter for screening for malignant neoplasm of cervix: Secondary | ICD-10-CM | POA: Diagnosis not present

## 2019-07-02 DIAGNOSIS — R635 Abnormal weight gain: Secondary | ICD-10-CM | POA: Diagnosis not present

## 2019-07-02 DIAGNOSIS — Z30431 Encounter for routine checking of intrauterine contraceptive device: Secondary | ICD-10-CM | POA: Diagnosis not present

## 2019-07-02 DIAGNOSIS — Z1389 Encounter for screening for other disorder: Secondary | ICD-10-CM | POA: Diagnosis not present

## 2019-07-02 DIAGNOSIS — R102 Pelvic and perineal pain: Secondary | ICD-10-CM | POA: Diagnosis not present

## 2019-07-02 DIAGNOSIS — Z01419 Encounter for gynecological examination (general) (routine) without abnormal findings: Secondary | ICD-10-CM | POA: Diagnosis not present

## 2019-07-02 DIAGNOSIS — N925 Other specified irregular menstruation: Secondary | ICD-10-CM | POA: Diagnosis not present

## 2019-07-09 ENCOUNTER — Ambulatory Visit
Admission: EM | Admit: 2019-07-09 | Discharge: 2019-07-09 | Disposition: A | Discharge status: Home / Self Care | Payer: 59 | Attending: Physician Assistant | Admitting: Physician Assistant

## 2019-07-09 DIAGNOSIS — H02846 Edema of left eye, unspecified eyelid: Secondary | ICD-10-CM

## 2019-07-09 DIAGNOSIS — H01005 Unspecified blepharitis left lower eyelid: Secondary | ICD-10-CM

## 2019-07-09 MED ORDER — LIDOCAINE-EPINEPHRINE-TETRACAINE (LET) TOPICAL GEL
3.0000 mL | Freq: Once | TOPICAL | Status: AC
  Administered 2019-07-09: 3 mL via TOPICAL

## 2019-07-09 MED ORDER — ERYTHROMYCIN 5 MG/GM OP OINT: TOPICAL_OINTMENT | OPHTHALMIC | 0 refills | Status: DC

## 2019-07-09 NOTE — ED Triage Notes (Signed)
Pt states woke up Thursday morning and felt like sand in her eye. States now has swelling and pain to lt upper eye lid.

## 2019-07-09 NOTE — Discharge Instructions (Signed)
Start erythromycin ointment as directed. Lid scrubs and warm compresses as directed. Monitor for any worsening of symptoms, changes in vision, sensitivity to light, eye swelling, painful eye movement, follow up with ophthalmology for further evaluation.  ° °

## 2019-07-09 NOTE — ED Provider Notes (Signed)
[facility]    CSN: [id number] Arrival date & time: 07/09/19  1717      History   Chief Complaint Chief Complaint  Patient presents with  . Eye Pain    HPI Nicole Mcintyre is a 39 y.o. female.   39 year old female comes in for 2 day history of left eye irritation. At first had foreign body sensation, now with left upper eyelid swelling and pain. No photophobia, vision changes. No contact lens/glasse use. Denies URI symptoms. Using vigamox BID without relief.      Past Medical History:  Diagnosis Date  . AMA (advanced maternal age) multigravida 35+   . Anemia   . Chicken pox   . Cystitis   . Depression   . Diverticulitis   . History of fainting spells of unknown cause   . Migraines   . Shingles   . Thyroid disease     Patient Active Problem List   Diagnosis Date Noted  . Preeclampsia in postpartum period 05/27/2017  . S/P repeat low transverse C-section 05/20/2017  . History of C-section 05/16/2017  . Chest pain 11/27/2015  . Fatigue 07/03/2015  . Obesity, morbid (HCC) 07/03/2015  . Right elbow pain 07/03/2015    Past Surgical History:  Procedure Laterality Date  . APPENDECTOMY    . CESAREAN SECTION    . CESAREAN SECTION N/A 05/20/2017   Procedure: REPEAT CESAREAN SECTION;  Surgeon: Huel Cote, MD;  Location: [facility] BIRTHING SUITES;  Service: Obstetrics;  Laterality: N/A;  MD request EXTRA 30 Mins and an RNFA- Tracey  . RHINOPLASTY      OB History    Gravida  2   Para  2   Term  2   Preterm      AB      Living  2     SAB      TAB      Ectopic      Multiple  0   Live Births  2            Home Medications    Prior to Admission medications   Medication Sig Start Date End Date Taking? Authorizing Provider  erythromycin ophthalmic ointment Place a 1/2 inch ribbon of ointment 4 times a day for 7 days 07/09/19   Belinda Fisher, PA-C    Family History Family History  Problem Relation Age of Onset  . Alcohol abuse Father   .  Diabetes Father   . Arthritis Maternal Grandmother   . Breast cancer Maternal Grandmother   . Alcohol abuse Maternal Grandfather   . Diabetes Paternal Grandmother   . Healthy Mother   . Anemia Mother   . Healthy Paternal Grandfather     Social History Social History   Tobacco Use  . Smoking status: Former Games developer  . Smokeless tobacco: Never Used  Substance Use Topics  . Alcohol use: Not Currently    Alcohol/week: 1.0 standard drink    Types: 1 Glasses of wine per week  . Drug use: No     Allergies   Latex   Review of Systems Review of Systems  Reason unable to perform ROS: See HPI as above.     Physical Exam Triage Vital Signs ED Triage Vitals  Enc Vitals Group     BP 07/09/19 1730 130/66     Pulse Rate 07/09/19 1730 77     Resp 07/09/19 1730 18     Temp 07/09/19 1730 98 F (36.7 C)  Temp Source 07/09/19 1730 Oral     SpO2 07/09/19 1730 100 %     Weight --      Height --      Head Circumference --      Peak Flow --      Pain Score 07/09/19 1731 2     Pain Loc --      Pain Edu? --      Excl. in [place]? --    No data found.  Updated Vital Signs BP 130/66 (BP Location: Left Arm)   Pulse 77   Temp 98 F (36.7 C) (Oral)   Resp 18   LMP 06/24/2019   SpO2 100%   Breastfeeding No   Visual Acuity Right Eye Distance: 20/25 Left Eye Distance: 20/30 Bilateral Distance: 20/25  Right Eye Near:   Left Eye Near:    Bilateral Near:     Physical Exam Constitutional:      General: She is not in acute distress.    Appearance: She is well-developed. She is not ill-appearing, toxic-appearing or diaphoretic.  HENT:     Head: Normocephalic and atraumatic.  Eyes:     Pupils: Pupils are equal, round, and reactive to light.     Comments: Left eyelid swelling without erythema, warmth. Left conjunctival injection. EOMI. Unable to fully evert left eyelid due to swelling. However, pulling out eyelid, able to see white lesion with surrounding erythema to the inside of  the eyelid. ?internal hordeolum   Neurological:     Mental Status: She is alert and oriented to person, place, and time.      UC Treatments / Results  Labs (all labs ordered are listed, but only abnormal results are displayed) Labs Reviewed - No data to display  EKG   Radiology No results found.  Procedures Procedures (including critical care time)  Medications Ordered in UC Medications  lidocaine-EPINEPHrine-tetracaine (LET) topical gel (3 mLs Topical Given 07/09/19 1757)    Initial Impression / Assessment and Plan / UC Course  I have reviewed the triage vital signs and the nursing notes.  Pertinent labs & imaging results that were available during my care of the patient were reviewed by me and considered in my medical decision making (see chart for details).    ? Internal hordeolum. Will start erythromycin ointment. Lids scrubs/warm compresses. Follow up with ophthalmology if symptoms not improving. Return precautions given.  Final Clinical Impressions(s) / UC Diagnoses   Final diagnoses:  Swelling of left eyelid  Blepharitis of left lower eyelid, unspecified type   ED Prescriptions    Medication Sig Dispense Auth. Provider   erythromycin ophthalmic ointment Place a 1/2 inch ribbon of ointment 4 times a day for 7 days 1 g Ok Edwards, PA-C     PDMP not reviewed this encounter.   Ok Edwards, PA-C 07/09/19 1802

## 2019-07-12 ENCOUNTER — Encounter: Payer: Self-pay | Admitting: Primary Care

## 2019-07-12 DIAGNOSIS — S0501XA Injury of conjunctiva and corneal abrasion without foreign body, right eye, initial encounter: Secondary | ICD-10-CM | POA: Diagnosis not present

## 2019-07-12 DIAGNOSIS — H00024 Hordeolum internum left upper eyelid: Secondary | ICD-10-CM | POA: Diagnosis not present

## 2019-07-15 DIAGNOSIS — N925 Other specified irregular menstruation: Secondary | ICD-10-CM | POA: Diagnosis not present

## 2019-07-15 DIAGNOSIS — N871 Moderate cervical dysplasia: Secondary | ICD-10-CM | POA: Diagnosis not present

## 2019-07-15 DIAGNOSIS — N939 Abnormal uterine and vaginal bleeding, unspecified: Secondary | ICD-10-CM | POA: Diagnosis not present

## 2019-07-15 DIAGNOSIS — N72 Inflammatory disease of cervix uteri: Secondary | ICD-10-CM | POA: Diagnosis not present

## 2019-07-15 DIAGNOSIS — Z8742 Personal history of other diseases of the female genital tract: Secondary | ICD-10-CM | POA: Diagnosis not present

## 2019-07-27 DIAGNOSIS — R102 Pelvic and perineal pain: Secondary | ICD-10-CM | POA: Diagnosis not present

## 2019-07-27 DIAGNOSIS — N899 Noninflammatory disorder of vagina, unspecified: Secondary | ICD-10-CM | POA: Diagnosis not present

## 2019-07-27 DIAGNOSIS — Z30432 Encounter for removal of intrauterine contraceptive device: Secondary | ICD-10-CM | POA: Diagnosis not present

## 2019-07-27 DIAGNOSIS — Z3009 Encounter for other general counseling and advice on contraception: Secondary | ICD-10-CM | POA: Diagnosis not present

## 2019-07-27 DIAGNOSIS — N926 Irregular menstruation, unspecified: Secondary | ICD-10-CM | POA: Diagnosis not present

## 2019-09-22 DIAGNOSIS — S0502XA Injury of conjunctiva and corneal abrasion without foreign body, left eye, initial encounter: Secondary | ICD-10-CM | POA: Diagnosis not present

## 2019-09-22 DIAGNOSIS — H1045 Other chronic allergic conjunctivitis: Secondary | ICD-10-CM | POA: Diagnosis not present

## 2019-09-22 DIAGNOSIS — H04123 Dry eye syndrome of bilateral lacrimal glands: Secondary | ICD-10-CM | POA: Diagnosis not present

## 2019-10-13 NOTE — Patient Instructions (Addendum)
YOU ARE SCHEDULED FOR A COVID TEST _10/11________@_9 :05___________. THIS TEST MUST BE DONE BEFORE SURGERY. GO TO  [address] [place], [place], IT IS APPROXIMATELY 2 MINUTES PAST [employer] ON THE RIGHT AND REMAIN IN YOUR CAR, THIS IS A DRIVE UP TEST. ONCE YOUR COVID TEST IS DONE PLEASE FOLLOW ALL THE QUARANTINE  INSTRUCTIONS GIVEN IN YOUR HANDOUT.      Your procedure is scheduled on 10?14/21   Report to [facility] AT 5:30 A. M.   Call this number if you have problems the morning of surgery  [phone].   OUR ADDRESS IS [address].  WE ARE LOCATED IN [facility].  PLEASE BRING YOUR INSURANCE CARD AND PHOTO ID DAY OF SURGERY.  ONLY ONE PERSON ALLOWED IN FACILITY WAITING AREA.                                     REMEMBER:  DO NOT EAT FOOD OR DRINK LIQUIDS AFTER MIDNIGHT .    YOU MAY  BRUSH YOUR TEETH MORNING OF SURGERY AND RINSE YOUR MOUTH OUT, NO CHEWING GUM CANDY OR MINTS.   TAKE THESE MEDICATIONS MORNING OF SURGERY WITH A SIP OF WATER:  ___none___  ONE VISITOR IS ALLOWED IN WAITING ROOM ONLY DAY OF SURGERY.    NO VISITOR MAY SPEND THE NIGHT.  VISITOR ARE ALLOWED TO STAY UNTIL 800 PM.                                    DO NOT WEAR JEWERLY, MAKE UP, OR NAIL POLISH ON FINGERNAILS.  DO NOT WEAR LOTIONS, POWDERS, PERFUMES OR DEODORANT.  DO NOT SHAVE FOR 24 HOURS PRIOR TO DAY OF SURGERY.   CONTACTS, GLASSES, OR DENTURES MAY NOT BE WORN TO SURGERY.                                    [facility] IS NOT RESPONSIBLE  FOR ANY BELONGINGS.                                                                    Marland Kitchen                                                                                                    [facility] - Preparing for Surgery  Before surgery, you can play an important role.   Because skin is not sterile, your skin needs to be as free of germs as possible.   You can reduce the number of germs on your skin by  washing with CHG (chlorahexidine gluconate)  soap before surgery.   CHG is an antiseptic cleaner which kills germs and bonds with the skin to continue killing germs even after washing. Please DO NOT use if you have an allergy to CHG or antibacterial soaps.   If your skin becomes reddened/irritated stop using the CHG and inform your nurse when you arrive at Short Stay. Do not shave (including legs and underarms) for at least 48 hours prior to the first CHG shower.    Please follow these instructions carefully:  1.  Shower with CHG Soap the night before surgery and the  morning of Surgery.  2.  If you choose to wash your hair, wash your hair first as usual with your  normal  shampoo.  3.  After you shampoo, rinse your hair and body thoroughly to remove the  shampoo.                                        4.  Use CHG as you would any other liquid soap.  You can apply chg directly  to the skin and wash                       Gently with a scrungie or clean washcloth.  5.  Apply the CHG Soap to your body ONLY FROM THE NECK DOWN.   Do not use on face/ open                           Wound or open sores. Avoid contact with eyes, ears mouth and genitals (private parts).                       Wash face,  Genitals (private parts) with your normal soap.             6.  Wash thoroughly, paying special attention to the area where your surgery  will be performed.  7.  Thoroughly rinse your body with warm water from the neck down.  8.  DO NOT shower/wash with your normal soap after using and rinsing off  the CHG Soap.             9.  Pat yourself dry with a clean towel.            10.  Wear clean pajamas.            11.  Place clean sheets on your bed the night of your first shower and do not  sleep with pets. Day of Surgery : Do not apply any lotions/deodorants the morning of surgery.  Please wear clean clothes to the hospital/surgery center.  FAILURE TO FOLLOW THESE INSTRUCTIONS MAY RESULT IN THE  CANCELLATION OF YOUR SURGERY PATIENT SIGNATURE_________________________________  NURSE SIGNATURE__________________________________  ________________________________________________________________________

## 2019-10-14 DIAGNOSIS — Z01818 Encounter for other preprocedural examination: Secondary | ICD-10-CM | POA: Diagnosis not present

## 2019-10-14 DIAGNOSIS — R102 Pelvic and perineal pain: Secondary | ICD-10-CM | POA: Diagnosis not present

## 2019-10-18 ENCOUNTER — Encounter (HOSPITAL_COMMUNITY)
Admission: RE | Admit: 2019-10-18 | Discharge: 2019-10-18 | Disposition: A | Discharge status: Home / Self Care | Payer: 59 | Source: Ambulatory Visit | Attending: Obstetrics and Gynecology | Admitting: Obstetrics and Gynecology

## 2019-10-18 ENCOUNTER — Other Ambulatory Visit: Payer: Self-pay

## 2019-10-18 ENCOUNTER — Encounter (HOSPITAL_COMMUNITY): Payer: Self-pay

## 2019-10-18 DIAGNOSIS — Z01812 Encounter for preprocedural laboratory examination: Secondary | ICD-10-CM | POA: Diagnosis not present

## 2019-10-18 LAB — CBC
HCT: 35.9 % — ABNORMAL LOW (ref 36.0–46.0)
Hemoglobin: 11.5 g/dL — ABNORMAL LOW (ref 12.0–15.0)
MCH: 30.7 pg (ref 26.0–34.0)
MCHC: 32 g/dL (ref 30.0–36.0)
MCV: 95.7 fL (ref 80.0–100.0)
Platelets: 222 10*3/uL (ref 150–400)
RBC: 3.75 MIL/uL — ABNORMAL LOW (ref 3.87–5.11)
RDW: 13.6 % (ref 11.5–15.5)
WBC: 5.9 10*3/uL (ref 4.0–10.5)
nRBC: 0 % (ref 0.0–0.2)

## 2019-10-18 NOTE — Progress Notes (Signed)
COVID Vaccine Completed:No Date COVID Vaccine completed: COVID vaccine manufacturer: [employer]   PCP - K. Clark NP Cardiologist - no  Chest x-ray - no EKG - no Stress Test - no ECHO - no Cardiac Cath - no Pacemaker/ICD device last checked:NA  Sleep Study - NA CPAP -   Fasting Blood Sugar - NA Checks Blood Sugar _____ times a day  Blood Thinner Instructions:NA Aspirin Instructions: Last Dose:  Anesthesia review:   Patient denies shortness of breath, fever, cough and chest pain at PAT appointment Yes   Patient verbalized understanding of instructions that were given to them at the PAT appointment. Patient was also instructed that they will need to review over the PAT instructions again at home before surgery. Yes Pt has no SOB climbing stairs. Working or with ADLs

## 2019-10-25 ENCOUNTER — Other Ambulatory Visit (HOSPITAL_COMMUNITY)
Admission: RE | Admit: 2019-10-25 | Discharge: 2019-10-25 | Disposition: A | Discharge status: Home / Self Care | Payer: 59 | Source: Ambulatory Visit | Attending: Obstetrics and Gynecology | Admitting: Obstetrics and Gynecology

## 2019-10-25 DIAGNOSIS — Z01812 Encounter for preprocedural laboratory examination: Secondary | ICD-10-CM | POA: Insufficient documentation

## 2019-10-25 DIAGNOSIS — Z20822 Contact with and (suspected) exposure to covid-19: Secondary | ICD-10-CM | POA: Insufficient documentation

## 2019-10-25 LAB — SARS CORONAVIRUS 2 (TAT 6-24 HRS): SARS Coronavirus 2: NEGATIVE

## 2019-10-27 NOTE — H&P (Signed)
Nicole Mcintyre is an 39 y.o.G70P2002 female presenting for scheduled surgery: TLH with bilateral salpingectomy and cystoscopy for multiple reasons.  Pt has had chronic pelvic pain, dyspareunia and irregular menses. She initially thought may be related to IUD however symptoms persisted after IUD removed. She is currently on ocps but does not desire to use long term.  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 She was offered a LEEP but opts for definitive management.  All options for conservative vs definitive management have been discussed with patient over the course of several visits and phone calls.   Pertinent Gynecological History: Menses: irregular, painful and heavy Bleeding: dysfunctional uterine bleeding Contraception: OCP (estrogen/progesterone) DES exposure: denies Blood transfusions: none Sexually transmitted diseases: no past history Previous GYN Procedures: colposcopy  Last mammogram: n/a Date:   Last pap: abnormal: ascus with pos hpv Date: 07/02/19 OB History: G2, P2002   Menstrual History: Menarche age: n/a No LMP recorded.    Past Medical History:  Diagnosis Date  . AMA (advanced maternal age) multigravida 35+   . Anemia   . Chicken pox   . Cystitis   . Diverticulitis   . History of fainting spells of unknown cause 2016   dropped blood sugar  . Migraines   . Shingles     Past Surgical History:  Procedure Laterality Date  . APPENDECTOMY    . CESAREAN SECTION    . CESAREAN SECTION N/A 05/20/2017   Procedure: REPEAT CESAREAN SECTION;  Surgeon: Huel Cote, MD;  Location: [facility] BIRTHING SUITES;  Service: Obstetrics;  Laterality: N/A;  MD request EXTRA 30 Mins and an RNFA- Tracey  . RHINOPLASTY      Family History  Problem Relation Age of Onset  . Alcohol abuse Father   . Diabetes Father   . Arthritis Maternal Grandmother   . Breast cancer Maternal Grandmother   . Alcohol abuse Maternal Grandfather   . Diabetes  Paternal Grandmother   . Healthy Mother   . Anemia Mother   . Healthy Paternal Grandfather     Social History:  reports that she quit smoking about 14 years ago. She has never used smokeless tobacco. She reports previous alcohol use of about 1.0 standard drink of alcohol per week. She reports that she does not use drugs.  Allergies:  Allergies  Allergen Reactions  . Latex Swelling    No medications prior to admission.    Review of Systems  There were no vitals taken for this visit. Physical Exam  No results found for this or any previous visit (from the past 24 hour(s)).  No results found.  Assessment/Plan: 39yo G2P2002 female with irregular menses, dyspareunia, chronic pelvic pain and HGSIL here for scheduled  Total laparoscopic hysterectomy with bilateral salpingectomy and cystoscopy  -Admit -ERAS protocol -Sars screen neg -Start Hg 11.5 - Review procedure with risks/benefits and address any residual questions - To OR when ready   Janean Sark Kodi Steil 10/27/2019, 2:27 AM

## 2019-10-27 NOTE — Anesthesia Preprocedure Evaluation (Addendum)
Anesthesia Evaluation  Patient identified by MRN, date of birth, ID band Patient awake    Reviewed: Allergy & Precautions, NPO status , Patient's Chart, lab work & pertinent test results  Airway Mallampati: III  TM Distance: >3 FB Neck ROM: Full    Dental  (+) Dental Advisory Given   Pulmonary former smoker,    breath sounds clear to auscultation       Cardiovascular negative cardio ROS   Rhythm:Regular Rate:Normal     Neuro/Psych  Headaches,    GI/Hepatic negative GI ROS, Neg liver ROS,   Endo/Other  negative endocrine ROS  Renal/GU negative Renal ROS     Musculoskeletal   Abdominal   Peds  Hematology  (+) anemia ,   Anesthesia Other Findings   Reproductive/Obstetrics                            Lab Results  Component Value Date   WBC 5.9 10/18/2019   HGB 11.5 (L) 10/18/2019   HCT 35.9 (L) 10/18/2019   MCV 95.7 10/18/2019   PLT 222 10/18/2019   Lab Results  Component Value Date   CREATININE 1.13 (H) 05/29/2017   BUN 7 05/29/2017   NA 141 05/29/2017   K 3.9 05/29/2017   CL 106 05/29/2017   CO2 26 05/29/2017    Anesthesia Physical Anesthesia Plan  ASA: III  Anesthesia Plan: General   Post-op Pain Management:    Induction: Intravenous  PONV Risk Score and Plan: 3 and Dexamethasone, Ondansetron and Treatment may vary due to age or medical condition  Airway Management Planned: Oral ETT  Additional Equipment: None  Intra-op Plan:   Post-operative Plan: Extubation in OR  Informed Consent: I have reviewed the patients History and Physical, chart, labs and discussed the procedure including the risks, benefits and alternatives for the proposed anesthesia with the patient or authorized representative who has indicated his/her understanding and acceptance.     Dental advisory given  Plan Discussed with: CRNA  Anesthesia Plan Comments:        Anesthesia Quick  Evaluation

## 2019-10-28 ENCOUNTER — Other Ambulatory Visit: Payer: Self-pay

## 2019-10-28 ENCOUNTER — Encounter (HOSPITAL_BASED_OUTPATIENT_CLINIC_OR_DEPARTMENT_OTHER): Payer: Self-pay | Admitting: Obstetrics and Gynecology

## 2019-10-28 ENCOUNTER — Ambulatory Visit (HOSPITAL_BASED_OUTPATIENT_CLINIC_OR_DEPARTMENT_OTHER): Payer: 59 | Admitting: Anesthesiology

## 2019-10-28 ENCOUNTER — Ambulatory Visit (HOSPITAL_BASED_OUTPATIENT_CLINIC_OR_DEPARTMENT_OTHER)
Admission: RE | Admit: 2019-10-28 | Discharge: 2019-10-29 | Disposition: A | Discharge status: Home / Self Care | Payer: 59 | Source: Other Acute Inpatient Hospital | Attending: Obstetrics and Gynecology | Admitting: Obstetrics and Gynecology

## 2019-10-28 ENCOUNTER — Encounter (HOSPITAL_BASED_OUTPATIENT_CLINIC_OR_DEPARTMENT_OTHER)
Admission: RE | Disposition: A | Discharge status: Home / Self Care | Payer: Self-pay | Source: Other Acute Inpatient Hospital | Attending: Obstetrics and Gynecology

## 2019-10-28 DIAGNOSIS — N736 Female pelvic peritoneal adhesions (postinfective): Secondary | ICD-10-CM | POA: Diagnosis not present

## 2019-10-28 DIAGNOSIS — D069 Carcinoma in situ of cervix, unspecified: Secondary | ICD-10-CM | POA: Insufficient documentation

## 2019-10-28 DIAGNOSIS — Z87891 Personal history of nicotine dependence: Secondary | ICD-10-CM | POA: Diagnosis not present

## 2019-10-28 DIAGNOSIS — Z9889 Other specified postprocedural states: Secondary | ICD-10-CM

## 2019-10-28 DIAGNOSIS — R102 Pelvic and perineal pain unspecified side: Secondary | ICD-10-CM | POA: Diagnosis present

## 2019-10-28 DIAGNOSIS — K660 Peritoneal adhesions (postprocedural) (postinfection): Secondary | ICD-10-CM | POA: Insufficient documentation

## 2019-10-28 DIAGNOSIS — N920 Excessive and frequent menstruation with regular cycle: Secondary | ICD-10-CM | POA: Diagnosis not present

## 2019-10-28 DIAGNOSIS — G8929 Other chronic pain: Secondary | ICD-10-CM | POA: Insufficient documentation

## 2019-10-28 DIAGNOSIS — N941 Unspecified dyspareunia: Secondary | ICD-10-CM | POA: Diagnosis not present

## 2019-10-28 DIAGNOSIS — R87613 High grade squamous intraepithelial lesion on cytologic smear of cervix (HGSIL): Secondary | ICD-10-CM | POA: Diagnosis not present

## 2019-10-28 DIAGNOSIS — D649 Anemia, unspecified: Secondary | ICD-10-CM | POA: Diagnosis not present

## 2019-10-28 HISTORY — PX: CYSTOSCOPY: SHX5120

## 2019-10-28 HISTORY — PX: LAPAROSCOPIC LYSIS OF ADHESIONS: SHX5905

## 2019-10-28 HISTORY — PX: TOTAL LAPAROSCOPIC HYSTERECTOMY WITH SALPINGECTOMY: SHX6742

## 2019-10-28 LAB — POCT PREGNANCY, URINE: Preg Test, Ur: NEGATIVE

## 2019-10-28 LAB — TYPE AND SCREEN
ABO/RH(D): A POS
Antibody Screen: NEGATIVE

## 2019-10-28 SURGERY — HYSTERECTOMY, TOTAL, LAPAROSCOPIC, WITH SALPINGECTOMY
Anesthesia: General | Site: Urethra

## 2019-10-28 MED ORDER — PROPOFOL 10 MG/ML IV BOLUS
INTRAVENOUS | Status: DC | PRN
  Administered 2019-10-28: 200 mg via INTRAVENOUS

## 2019-10-28 MED ORDER — DEXTROSE 5 % IV SOLN
3.0000 g | INTRAVENOUS | Status: AC
  Administered 2019-10-28: 3 g via INTRAVENOUS
  Filled 2019-10-28: qty 3

## 2019-10-28 MED ORDER — BUPIVACAINE HCL (PF) 0.25 % IJ SOLN
INTRAMUSCULAR | Status: DC | PRN
  Administered 2019-10-28: 14 mL

## 2019-10-28 MED ORDER — GABAPENTIN 100 MG PO CAPS
100.0000 mg | ORAL_CAPSULE | Freq: Two times a day (BID) | ORAL | Status: DC
  Administered 2019-10-28: 100 mg via ORAL

## 2019-10-28 MED ORDER — ACETAMINOPHEN 325 MG PO TABS
650.0000 mg | ORAL_TABLET | ORAL | Status: DC | PRN
  Administered 2019-10-28 – 2019-10-29 (×4): 650 mg via ORAL

## 2019-10-28 MED ORDER — ACETAMINOPHEN 500 MG PO TABS
1000.0000 mg | ORAL_TABLET | Freq: Once | ORAL | Status: AC
  Administered 2019-10-28: 1000 mg via ORAL

## 2019-10-28 MED ORDER — LACTATED RINGERS IV SOLN: INTRAVENOUS | Status: DC

## 2019-10-28 MED ORDER — ACETAMINOPHEN 500 MG PO TABS
ORAL_TABLET | ORAL | Status: AC
  Filled 2019-10-28: qty 2

## 2019-10-28 MED ORDER — POVIDONE-IODINE 10 % EX SWAB
2.0000 "application " | Freq: Once | CUTANEOUS | Status: AC
  Administered 2019-10-28: 2 via TOPICAL

## 2019-10-28 MED ORDER — SCOPOLAMINE 1 MG/3DAYS TD PT72
MEDICATED_PATCH | TRANSDERMAL | Status: AC
  Filled 2019-10-28: qty 1

## 2019-10-28 MED ORDER — LIDOCAINE 2% (20 MG/ML) 5 ML SYRINGE
INTRAMUSCULAR | Status: AC
  Filled 2019-10-28: qty 5

## 2019-10-28 MED ORDER — AMISULPRIDE (ANTIEMETIC) 5 MG/2ML IV SOLN: 10.0000 mg | Freq: Once | INTRAVENOUS | Status: DC | PRN

## 2019-10-28 MED ORDER — LIDOCAINE 2% (20 MG/ML) 5 ML SYRINGE
INTRAMUSCULAR | Status: AC
  Filled 2019-10-28: qty 15

## 2019-10-28 MED ORDER — ONDANSETRON HCL 4 MG PO TABS: 4.0000 mg | ORAL_TABLET | Freq: Four times a day (QID) | ORAL | Status: DC | PRN

## 2019-10-28 MED ORDER — IBUPROFEN 800 MG PO TABS
ORAL_TABLET | ORAL | Status: AC
  Filled 2019-10-28: qty 1

## 2019-10-28 MED ORDER — ONDANSETRON HCL 4 MG/2ML IJ SOLN
INTRAMUSCULAR | Status: DC | PRN
  Administered 2019-10-28: 4 mg via INTRAVENOUS

## 2019-10-28 MED ORDER — MIDAZOLAM HCL 2 MG/2ML IJ SOLN
INTRAMUSCULAR | Status: DC | PRN
  Administered 2019-10-28: 2 mg via INTRAVENOUS

## 2019-10-28 MED ORDER — GABAPENTIN 300 MG PO CAPS
300.0000 mg | ORAL_CAPSULE | ORAL | Status: AC
  Administered 2019-10-28: 300 mg via ORAL

## 2019-10-28 MED ORDER — KETOROLAC TROMETHAMINE 30 MG/ML IJ SOLN: 30.0000 mg | Freq: Once | INTRAMUSCULAR | Status: AC | PRN

## 2019-10-28 MED ORDER — FENTANYL CITRATE (PF) 100 MCG/2ML IJ SOLN
INTRAMUSCULAR | Status: AC
  Filled 2019-10-28: qty 2

## 2019-10-28 MED ORDER — OXYCODONE HCL 5 MG PO TABS
ORAL_TABLET | ORAL | Status: AC
  Filled 2019-10-28: qty 2

## 2019-10-28 MED ORDER — ACETAMINOPHEN 500 MG PO TABS: 1000.0000 mg | ORAL_TABLET | ORAL | Status: AC

## 2019-10-28 MED ORDER — KETAMINE HCL 10 MG/ML IJ SOLN
INTRAMUSCULAR | Status: AC
  Filled 2019-10-28: qty 1

## 2019-10-28 MED ORDER — CEFAZOLIN SODIUM-DEXTROSE 2-4 GM/100ML-% IV SOLN
INTRAVENOUS | Status: AC
  Filled 2019-10-28: qty 100

## 2019-10-28 MED ORDER — FENTANYL CITRATE (PF) 250 MCG/5ML IJ SOLN
INTRAMUSCULAR | Status: AC
  Filled 2019-10-28: qty 5

## 2019-10-28 MED ORDER — DOCUSATE SODIUM 100 MG PO CAPS
100.0000 mg | ORAL_CAPSULE | Freq: Two times a day (BID) | ORAL | Status: DC
  Administered 2019-10-28: 100 mg via ORAL

## 2019-10-28 MED ORDER — FENTANYL CITRATE (PF) 100 MCG/2ML IJ SOLN
25.0000 ug | INTRAMUSCULAR | Status: DC | PRN
  Administered 2019-10-28 (×2): 25 ug via INTRAVENOUS

## 2019-10-28 MED ORDER — MENTHOL 3 MG MT LOZG: 1.0000 | LOZENGE | OROMUCOSAL | Status: DC | PRN

## 2019-10-28 MED ORDER — ROCURONIUM BROMIDE 10 MG/ML (PF) SYRINGE
PREFILLED_SYRINGE | INTRAVENOUS | Status: DC | PRN
  Administered 2019-10-28: 50 mg via INTRAVENOUS
  Administered 2019-10-28: 20 mg via INTRAVENOUS

## 2019-10-28 MED ORDER — KETAMINE HCL 10 MG/ML IJ SOLN
INTRAMUSCULAR | Status: DC | PRN
  Administered 2019-10-28: 30 mg via INTRAVENOUS

## 2019-10-28 MED ORDER — SENNOSIDES-DOCUSATE SODIUM 8.6-50 MG PO TABS
1.0000 | ORAL_TABLET | Freq: Every evening | ORAL | Status: DC | PRN
  Filled 2019-10-28: qty 1

## 2019-10-28 MED ORDER — SODIUM CHLORIDE 0.9 % IR SOLN
Status: DC | PRN
  Administered 2019-10-28: 3000 mL

## 2019-10-28 MED ORDER — KETOROLAC TROMETHAMINE 30 MG/ML IJ SOLN: 30.0000 mg | Freq: Once | INTRAMUSCULAR | Status: DC

## 2019-10-28 MED ORDER — SUGAMMADEX SODIUM 200 MG/2ML IV SOLN
INTRAVENOUS | Status: DC | PRN
  Administered 2019-10-28: 200 mg via INTRAVENOUS

## 2019-10-28 MED ORDER — SCOPOLAMINE 1 MG/3DAYS TD PT72
1.0000 | MEDICATED_PATCH | Freq: Once | TRANSDERMAL | Status: DC
  Administered 2019-10-28: 1.5 mg via TRANSDERMAL

## 2019-10-28 MED ORDER — ALUM & MAG HYDROXIDE-SIMETH 200-200-20 MG/5ML PO SUSP: 30.0000 mL | ORAL | Status: DC | PRN

## 2019-10-28 MED ORDER — OXYCODONE HCL 5 MG PO TABS
ORAL_TABLET | ORAL | Status: AC
  Filled 2019-10-28: qty 1

## 2019-10-28 MED ORDER — FLUORESCEIN SODIUM 10 % IV SOLN
INTRAVENOUS | Status: DC | PRN
  Administered 2019-10-28: 1 mL via INTRAVENOUS

## 2019-10-28 MED ORDER — LIDOCAINE 2% (20 MG/ML) 5 ML SYRINGE
INTRAMUSCULAR | Status: DC | PRN
  Administered 2019-10-28: 50 mg via INTRAVENOUS

## 2019-10-28 MED ORDER — GABAPENTIN 100 MG PO CAPS
ORAL_CAPSULE | ORAL | Status: AC
  Filled 2019-10-28: qty 1

## 2019-10-28 MED ORDER — DOCUSATE SODIUM 100 MG PO CAPS
ORAL_CAPSULE | ORAL | Status: AC
  Filled 2019-10-28: qty 1

## 2019-10-28 MED ORDER — GABAPENTIN 300 MG PO CAPS
ORAL_CAPSULE | ORAL | Status: AC
  Filled 2019-10-28: qty 1

## 2019-10-28 MED ORDER — OXYCODONE HCL 5 MG PO TABS
5.0000 mg | ORAL_TABLET | ORAL | Status: DC | PRN
  Administered 2019-10-28: 5 mg via ORAL
  Administered 2019-10-28 (×2): 10 mg via ORAL
  Administered 2019-10-28: 5 mg via ORAL
  Administered 2019-10-29 (×2): 10 mg via ORAL

## 2019-10-28 MED ORDER — KETOROLAC TROMETHAMINE 30 MG/ML IJ SOLN
INTRAMUSCULAR | Status: DC | PRN
  Administered 2019-10-28: 30 mg via INTRAVENOUS

## 2019-10-28 MED ORDER — LIDOCAINE 2% (20 MG/ML) 5 ML SYRINGE
INTRAMUSCULAR | Status: DC | PRN
  Administered 2019-10-28: 1.5 mg/kg/h via INTRAVENOUS

## 2019-10-28 MED ORDER — ACETAMINOPHEN 325 MG PO TABS
ORAL_TABLET | ORAL | Status: AC
  Filled 2019-10-28: qty 2

## 2019-10-28 MED ORDER — PROPOFOL 10 MG/ML IV BOLUS
INTRAVENOUS | Status: AC
  Filled 2019-10-28: qty 40

## 2019-10-28 MED ORDER — DEXAMETHASONE SODIUM PHOSPHATE 10 MG/ML IJ SOLN
INTRAMUSCULAR | Status: DC | PRN
  Administered 2019-10-28: 10 mg via INTRAVENOUS

## 2019-10-28 MED ORDER — ONDANSETRON HCL 4 MG/2ML IJ SOLN: 4.0000 mg | Freq: Four times a day (QID) | INTRAMUSCULAR | Status: DC | PRN

## 2019-10-28 MED ORDER — MIDAZOLAM HCL 2 MG/2ML IJ SOLN
INTRAMUSCULAR | Status: AC
  Filled 2019-10-28: qty 2

## 2019-10-28 MED ORDER — FENTANYL CITRATE (PF) 100 MCG/2ML IJ SOLN
INTRAMUSCULAR | Status: DC | PRN
Start: 2019-10-28 — End: 2019-10-28
  Administered 2019-10-28 (×3): 50 ug via INTRAVENOUS

## 2019-10-28 MED ORDER — FLUORESCEIN SODIUM 10 % IV SOLN
INTRAVENOUS | Status: AC
  Filled 2019-10-28: qty 5

## 2019-10-28 MED ORDER — IBUPROFEN 800 MG PO TABS
800.0000 mg | ORAL_TABLET | Freq: Three times a day (TID) | ORAL | Status: DC
  Administered 2019-10-28 – 2019-10-29 (×3): 800 mg via ORAL

## 2019-10-28 MED ORDER — CEFAZOLIN SODIUM-DEXTROSE 1-4 GM/50ML-% IV SOLN
INTRAVENOUS | Status: AC
  Filled 2019-10-28: qty 50

## 2019-10-28 SURGICAL SUPPLY — 52 items
ADH SKN CLS APL DERMABOND .7 (GAUZE/BANDAGES/DRESSINGS) ×3
BAG DRN RND TRDRP ANRFLXCHMBR (UROLOGICAL SUPPLIES) ×3
BAG URINE DRAIN 2000ML AR STRL (UROLOGICAL SUPPLIES) ×4 IMPLANT
CATH SILICONE 14FRX5CC (CATHETERS) ×4 IMPLANT
CLOTH BEACON ORANGE TIMEOUT ST (SAFETY) ×4 IMPLANT
COVER BACK TABLE 60X90IN (DRAPES) ×4 IMPLANT
COVER MAYO STAND STRL (DRAPES) ×4 IMPLANT
COVER WAND RF STERILE (DRAPES) ×4 IMPLANT
DERMABOND ADVANCED (GAUZE/BANDAGES/DRESSINGS) ×1
DERMABOND ADVANCED .7 DNX12 (GAUZE/BANDAGES/DRESSINGS) ×3 IMPLANT
DEVICE SUTURE ENDOST 10MM (ENDOMECHANICALS) ×4 IMPLANT
DURAPREP 26ML APPLICATOR (WOUND CARE) ×4 IMPLANT
FILTER SMOKE EVAC LAPAROSHD (FILTER) ×4 IMPLANT
GAUZE 4X4 16PLY RFD (DISPOSABLE) ×4 IMPLANT
GLOVE BIOGEL PI IND STRL 6.5 (GLOVE) ×3 IMPLANT
GLOVE BIOGEL PI IND STRL 8 (GLOVE) ×9 IMPLANT
GLOVE BIOGEL PI INDICATOR 6.5 (GLOVE) ×1
GLOVE BIOGEL PI INDICATOR 8 (GLOVE) ×3
GLOVE SURG SS PI 6.5 STRL IVOR (GLOVE) ×16 IMPLANT
GLOVE SURG SS PI 7.0 STRL IVOR (GLOVE) ×8 IMPLANT
GOWN STRL REUS W/TWL LRG LVL3 (GOWN DISPOSABLE) ×16 IMPLANT
HOLDER FOLEY CATH W/STRAP (MISCELLANEOUS) ×4 IMPLANT
IV NS IRRIG 3000ML ARTHROMATIC (IV SOLUTION) ×4 IMPLANT
NS IRRIG 1000ML POUR BTL (IV SOLUTION) ×4 IMPLANT
OCCLUDER COLPOPNEUMO (BALLOONS) ×4 IMPLANT
PACK LAPAROSCOPY BASIN (CUSTOM PROCEDURE TRAY) ×4 IMPLANT
PACK TRENDGUARD 600 HYBRD PROC (MISCELLANEOUS) ×3 IMPLANT
SCISSORS LAP 5X35 DISP (ENDOMECHANICALS) IMPLANT
SET IRRIG Y TYPE TUR BLADDER L (SET/KITS/TRAYS/PACK) IMPLANT
SET SUCTION IRRIG HYDROSURG (IRRIGATION / IRRIGATOR) ×4 IMPLANT
SET TRI-LUMEN FLTR TB AIRSEAL (TUBING) ×4 IMPLANT
SET TUBE SMOKE EVAC HIGH FLOW (TUBING) ×4 IMPLANT
SHEARS HARMONIC ACE PLUS 36CM (ENDOMECHANICALS) ×4 IMPLANT
SUT ENDO VLOC 180-0-8IN (SUTURE) ×4 IMPLANT
SUT PLAIN 2 0 XLH (SUTURE) IMPLANT
SUT VIC AB 0 CT1 27 (SUTURE) ×8
SUT VIC AB 0 CT1 27XBRD ANBCTR (SUTURE) ×6 IMPLANT
SUT VIC AB 4-0 PS2 18 (SUTURE) ×4 IMPLANT
SUT VIC AB 4-0 PS2 27 (SUTURE) ×4 IMPLANT
SUT VICRYL 0 UR6 27IN ABS (SUTURE) ×4 IMPLANT
SYR 10ML LL (SYRINGE) IMPLANT
SYR 50ML LL SCALE MARK (SYRINGE) ×4 IMPLANT
SYSTEM CARTER THOMASON II (TROCAR) ×4 IMPLANT
TIP UTERINE 6.7X8CM BLUE DISP (MISCELLANEOUS) ×4 IMPLANT
TOWEL OR 17X26 10 PK STRL BLUE (TOWEL DISPOSABLE) ×8 IMPLANT
TRAY FOLEY W/BAG SLVR 14FR LF (SET/KITS/TRAYS/PACK) IMPLANT
TRENDGUARD 600 HYBRID PROC PK (MISCELLANEOUS) ×4
TROCAR BLADELESS OPT 5 100 (ENDOMECHANICALS) ×12 IMPLANT
TROCAR BLADELESS OPT 5 150 (ENDOMECHANICALS) ×4 IMPLANT
TROCAR PORT AIRSEAL 5X120 (TROCAR) ×4 IMPLANT
TROCAR XCEL NON-BLD 11X100MML (ENDOMECHANICALS) ×4 IMPLANT
WARMER LAPAROSCOPE (MISCELLANEOUS) ×4 IMPLANT

## 2019-10-28 NOTE — Progress Notes (Signed)
Patient ID: Nicole Mcintyre, female   DOB: 1980/02/25, 39 y.o.   MRN: [id number] Pt doing better now. Had uncontrolled pain earlier in afternoon; improved with addition of tylenol to 10mg  oxycodone. She is tolerating PO, voiding well per foley and has ambulated the hallway.  VS: 119-151/56-85, 59 GEN - NAD ABD - heating pad on abdomen, soft, ND, diffuse tenderness as expected EXT - SCDs in place  A/P: POD#0 s/p TLH/BS, cystoscopy - stable         Routine post op care         Anticipate discharge to home tomorrow

## 2019-10-28 NOTE — Brief Op Note (Signed)
10/28/2019  10:22 AM  PATIENT:  Nicole Mcintyre  39 y.o. female  PRE-OPERATIVE DIAGNOSIS:  pain in pelvis  POST-OPERATIVE DIAGNOSIS:  pain in pelvis, lysis of adhesions  PROCEDURE:  Procedure(s): TOTAL LAPAROSCOPIC HYSTERECTOMY WITH SALPINGECTOMY (Bilateral) CYSTOSCOPY (N/A) LAPAROSCOPIC LYSIS OF ADHESIONS  SURGEON:  Surgeon(s) and Role:    * Chloe Miyoshi, Sharol Given, DO - Primary    * Shivaji, Valerie Roys, MD - Assisting  PHYSICIAN ASSISTANT:   ASSISTANTS: none   ANESTHESIA:   general  EBL:  150 mL   BLOOD ADMINISTERED:none  DRAINS: Urinary Catheter (Foley)   LOCAL MEDICATIONS USED:  MARCAINE     SPECIMEN:  No Specimen: Uterus, bilateral fallopian tubes, cervix  DISPOSITION OF SPECIMEN:  PATHOLOGY  COUNTS:  YES  TOURNIQUET:  * No tourniquets in log *  DICTATION: .Note written in EPIC  PLAN OF CARE: Admit for overnight observation  PATIENT DISPOSITION:  PACU - hemodynamically stable.   Delay start of Pharmacological VTE agent (>24hrs) due to surgical blood loss or risk of bleeding: yes

## 2019-10-28 NOTE — Op Note (Signed)
Operative Note    Preoperative Diagnosis 1. Pelvic pain 2. Menorrhagia 3. HGSIL/persistent +HPV   Postoperative Diagnosis  Same 4. Intra -abdominal adhesions   Procedure: Total laparoscopic hysterectomy with bilateral salpingectomy, cystoscopy   Surgeon: Britt Bottom DO Assist: Ellison Hughs MD  Anesthesia: General  Fluids: LR EBL: UOP:   Findings: Grossly normal uterus with mildly adhesed ovaries and fallopian tubes bilaterally, intra-abdominal adhesions of bowel to anterior abdominal wall   Specimen   Procedure Note  Pt seen in pre-op. Procedure reviewed and all questions answered; consent verified  Pt taken to operating room and placed in dorsal lithotomy position with her arms safely positioned at her sides. General anesthesia was administered and found to be adequate. Pt was prepped and draped in sterile fashion and a timeout performed. A weighted speculum was placed in the posterior fornix and a sim retractor placed anteriorly. Excellent visualization of the cervix was obtained. Uterus was sounded to 10cm, retroverted, so a size 8 koh was assembled with a medium cup and placed with retention sutures at 3 and 9 o'clock. A foley catheter was also placed in a sterile fashion Legs were then lowered and attention turned to her abdomen.  Here a 37mm incision was made at the umbilicus. A 7mm optiview trocar was then placed with the abdomen tented upwards. Upon entry, visualization was noted to be compromised. Thus after one more attempt to reposition the trocar, decision was made to proceed with a palmers point entry.   With the position identified, and an orogastric tube placement confirmed,  0.25% marcaine was injected and a 57mm trocar placed. Pneumoperitoneum was obtained with opening pressure of . The laparoscopic camera was used to confirm placement. An intra-abdominal adhesion to the anterior abdominal wall right near the umbilicus was noted. A  second trocar was then placed in the right lower quadrant under direct visualization. The adhesion was then reduced using the harmonic scalpel. Hemostasis was noted and visualization of the rest of the pelvis improved.   At this time, the umbilical trocar was replaced and a 92mm port was then placed under direct visualization in left lower quadrantwith care taken to avoid the epigastric vessels.  The patient was placed in trendelenberg and gross survey of pelvis done.  Starting on the the patients left, the left fallopian tube was then grasped with a blunt grasper, elevated and excised using the harmonic hemostatically.It was noted to be adherent to the ovary.  Next the utero-ovarian ligament and the round ligaments were sequentially grasped and excised. The broad ligament was then separated from the uterus as well with a bladder flap created. The cardinal ligament was then excised next at the utero-cervical junction and the ovarian vessel clamped, cauterized and cut. The same was done on the patients right with similar anatomic findings and results.  Next the bladder reflection was dissected away. The vaginal occluder had been filled with 60cc of saline.  Starting anteriorly and working laterally, the uterus and cervix were amputated off the superior aspect of the vagina. The uterus, cervix and fallopian tubes were removed vaginally.  The pelvis was irrigated and hemostasis noted. The angles of the vaginal cuff were easily seen and using the endostitch with an 0 vicryl v-lock suture, the cuff was closed in a running fashion with 2-3 back stitches to ensure closure.  Further irrigation of the pelvis confirmed no abnormalities or bleeding. Thus, patient was flattened. The 54mm port site was closed with 0-vicryl suture using a carter  thomasen to ensure closure of the peritoneum.  The remaining  trocars were removed under direct visualization and gas allowed to escape.  Incision sites were closed with 4-0  vicryl suture and dermabond. Counts were noted to be correct. Patient was awakened and taken to recovery room in stable status.  Foley was left in place

## 2019-10-28 NOTE — Anesthesia Procedure Notes (Signed)
Procedure Name: Intubation Date/Time: 10/28/2019 7:51 AM Performed by: Mechele Claude, CRNA Pre-anesthesia Checklist: Patient identified, Emergency Drugs available, Suction available and Patient being monitored Patient Re-evaluated:Patient Re-evaluated prior to induction Oxygen Delivery Method: Circle system utilized Preoxygenation: Pre-oxygenation with 100% oxygen Induction Type: IV induction and Cricoid Pressure applied Ventilation: Mask ventilation without difficulty Laryngoscope Size: Mac and 3 Grade View: Grade II Tube type: Oral Tube size: 7.0 mm Number of attempts: 1 Airway Equipment and Method: Stylet and Oral airway Placement Confirmation: ETT inserted through vocal cords under direct vision,  positive ETCO2 and breath sounds checked- equal and bilateral Secured at: 21 cm Tube secured with: Tape Dental Injury: Teeth and Oropharynx as per pre-operative assessment

## 2019-10-28 NOTE — Anesthesia Postprocedure Evaluation (Signed)
Anesthesia Post Note  Patient: Nicole Mcintyre  Procedure(s) Performed: TOTAL LAPAROSCOPIC HYSTERECTOMY WITH SALPINGECTOMY (Bilateral Abdomen) CYSTOSCOPY (N/A Urethra) LAPAROSCOPIC LYSIS OF ADHESIONS (Abdomen)     Patient location during evaluation: PACU Anesthesia Type: General Level of consciousness: awake and alert Pain management: pain level controlled Vital Signs Assessment: post-procedure vital signs reviewed and stable Respiratory status: spontaneous breathing, nonlabored ventilation, respiratory function stable and patient connected to nasal cannula oxygen Cardiovascular status: blood pressure returned to baseline and stable Postop Assessment: no apparent nausea or vomiting Anesthetic complications: no   No complications documented.  Last Vitals:  Vitals:   10/28/19 1214 10/28/19 1247  BP: 133/74 (!) 119/56  Pulse: 65 63  Resp: 15 15  Temp: 36.8 C 36.6 C  SpO2: 95% 95%    Last Pain:  Vitals:   10/28/19 1247  TempSrc:   PainSc: 6                  Kennieth Rad

## 2019-10-28 NOTE — Transfer of Care (Signed)
Immediate Anesthesia Transfer of Care Note  Patient: Nicole Mcintyre  Procedure(s) Performed: Procedure(s) (LRB): TOTAL LAPAROSCOPIC HYSTERECTOMY WITH SALPINGECTOMY (Bilateral) CYSTOSCOPY (N/A) LAPAROSCOPIC LYSIS OF ADHESIONS  Patient Location: PACU  Anesthesia Type: General  Level of Consciousness: awake, alert  and oriented  Airway & Oxygen Therapy: Patient Spontanous Breathing and Patient connected to face mask oxygen  Post-op Assessment: Report given to PACU RN and Post -op Vital signs reviewed and stable  Post vital signs: Reviewed and stable  Complications: No apparent anesthesia complications Last Vitals:  Vitals Value Taken Time  BP 123/78 10/28/19 1024  Temp    Pulse 72 10/28/19 1028  Resp 21 10/28/19 1028  SpO2 98 % 10/28/19 1028  Vitals shown include unvalidated device data.  Last Pain:  Vitals:   10/28/19 0603  TempSrc: Oral  PainSc: 0-No pain      Patients Stated Pain Goal: 7 (10/28/19 0603)  Complications: No complications documented.

## 2019-10-28 NOTE — Interval H&P Note (Signed)
History and Physical Interval Note: Pt seen and examined. No change from H/P Reviewed expectations.  To OR when ready  10/28/2019 7:35 AM  Nicole Mcintyre  has presented today for surgery, with the diagnosis of pain in pelvis.  The various methods of treatment have been discussed with the patient and family. After consideration of risks, benefits and other options for treatment, the patient has consented to  Procedure(s): TOTAL LAPAROSCOPIC HYSTERECTOMY WITH SALPINGECTOMY (Bilateral) CYSTOSCOPY (N/A) as a surgical intervention.  The patient's history has been reviewed, patient examined, no change in status, stable for surgery.  I have reviewed the patient's chart and labs.  Questions were answered to the patient's satisfaction.     Cathrine Muster

## 2019-10-29 DIAGNOSIS — K660 Peritoneal adhesions (postprocedural) (postinfection): Secondary | ICD-10-CM | POA: Diagnosis not present

## 2019-10-29 DIAGNOSIS — N920 Excessive and frequent menstruation with regular cycle: Secondary | ICD-10-CM | POA: Diagnosis not present

## 2019-10-29 DIAGNOSIS — N941 Unspecified dyspareunia: Secondary | ICD-10-CM | POA: Diagnosis not present

## 2019-10-29 DIAGNOSIS — D069 Carcinoma in situ of cervix, unspecified: Secondary | ICD-10-CM | POA: Diagnosis not present

## 2019-10-29 DIAGNOSIS — R102 Pelvic and perineal pain: Secondary | ICD-10-CM | POA: Diagnosis not present

## 2019-10-29 DIAGNOSIS — Z87891 Personal history of nicotine dependence: Secondary | ICD-10-CM | POA: Diagnosis not present

## 2019-10-29 DIAGNOSIS — G8929 Other chronic pain: Secondary | ICD-10-CM | POA: Diagnosis not present

## 2019-10-29 MED ORDER — OXYCODONE HCL 5 MG PO TABS
5.0000 mg | ORAL_TABLET | ORAL | 0 refills | Status: AC | PRN
Start: 2019-10-29 — End: 2019-11-05

## 2019-10-29 MED ORDER — ACETAMINOPHEN 325 MG PO TABS
ORAL_TABLET | ORAL | Status: AC
  Filled 2019-10-29: qty 2

## 2019-10-29 MED ORDER — OXYCODONE HCL 5 MG PO TABS
ORAL_TABLET | ORAL | Status: AC
  Filled 2019-10-29: qty 2

## 2019-10-29 MED ORDER — IBUPROFEN 800 MG PO TABS: 800.0000 mg | ORAL_TABLET | Freq: Three times a day (TID) | ORAL | 0 refills | Status: DC | PRN

## 2019-10-29 MED ORDER — IBUPROFEN 800 MG PO TABS
ORAL_TABLET | ORAL | Status: AC
  Filled 2019-10-29: qty 1

## 2019-10-29 MED ORDER — ACETAMINOPHEN 325 MG PO TABS: 650.0000 mg | ORAL_TABLET | ORAL | 1 refills | Status: DC | PRN

## 2019-10-29 NOTE — Progress Notes (Signed)
1 Day Post-Op Procedure(s) (LRB): TOTAL LAPAROSCOPIC HYSTERECTOMY WITH SALPINGECTOMY (Bilateral) CYSTOSCOPY (N/A) LAPAROSCOPIC LYSIS OF ADHESIONS  Subjective: Patient reports incisional pain, tolerating PO, + flatus and no problems voiding since foley removed. She has ambulated in the hallways twice this am. Pain generally well controlled. Ready for discharge to home today  Objective: I have reviewed patient's vital signs, intake and output, medications and labs.  General: alert and no distress GI: soft, non-tender; bowel sounds normal; no masses,  no organomegaly Extremities: extremities normal, atraumatic, no cyanosis or edema  Assessment: s/p Procedure(s): TOTAL LAPAROSCOPIC HYSTERECTOMY WITH SALPINGECTOMY (Bilateral) CYSTOSCOPY (N/A) LAPAROSCOPIC LYSIS OF ADHESIONS: stable  Plan: Discharge home  Instructions reviewed  LOS: 0 days    Nicole Mcintyre 10/29/2019, 9:16 AM

## 2019-10-29 NOTE — Discharge Instructions (Signed)
Laparoscopically Assisted Vaginal Hysterectomy, Care After This sheet gives you information about how to care for yourself after your procedure. Your health care provider may also give you more specific instructions. If you have problems or questions, contact your health care provider. What can I expect after the procedure? After the procedure, it is common to have:  Soreness and numbness in your incision areas.  Abdominal pain. You will be given pain medicine to control it.  Vaginal bleeding and discharge. You will need to use a sanitary napkin after this procedure.  Sore throat from the breathing tube that was inserted during surgery. Follow these instructions at home: Medicines  Take over-the-counter and prescription medicines only as told by your health care provider.  Do not take aspirin or ibuprofen. These medicines can cause bleeding.  Do not drive or use heavy machinery while taking prescription pain medicine.  Do not drive for 24 hours if you were given a medicine to help you relax (sedative) during the procedure. Incision care   Follow instructions from your health care provider about how to take care of your incisions. Make sure you: ? Wash your hands with soap and water before you change your bandage (dressing). If soap and water are not available, use hand sanitizer. ? Change your dressing as told by your health care provider. ? Leave stitches (sutures), skin glue, or adhesive strips in place. These skin closures may need to stay in place for 2 weeks or longer. If adhesive strip edges start to loosen and curl up, you may trim the loose edges. Do not remove adhesive strips completely unless your health care provider tells you to do that.  Check your incision area every day for signs of infection. Check for: ? Redness, swelling, or pain. ? Fluid or blood. ? Warmth. ? Pus or a bad smell. Activity  Get regular exercise as told by your health care provider. You may be  told to take short walks every day and go farther each time.  Return to your normal activities as told by your health care provider. Ask your health care provider what activities are safe for you.  Do not douche, use tampons, or have sexual intercourse for at least 6 weeks, or until your health care provider gives you permission.  Do not lift anything that is heavier than 10 lb (4.5 kg), or the limit that your health care provider tells you, until he or she says that it is safe. General instructions  Do not take baths, swim, or use a hot tub until your health care provider approves. Take showers instead of baths.  Do not drive for 24 hours if you received a sedative.  Do not drive or operate heavy machinery while taking prescription pain medicine.  To prevent or treat constipation while you are taking prescription pain medicine, your health care provider may recommend that you: ? Drink enough fluid to keep your urine clear or pale yellow. ? Take over-the-counter or prescription medicines. ? Eat foods that are high in fiber, such as fresh fruits and vegetables, whole grains, and beans. ? Limit foods that are high in fat and processed sugars, such as fried and sweet foods.  Keep all follow-up visits as told by your health care provider. This is important. Contact a health care provider if:  You have signs of infection, such as: ? Redness, swelling, or pain around your incision sites. ? Fluid or blood coming from an incision. ? An incision that feels warm to the  touch. ? Pus or a bad smell coming from an incision.  Your incision breaks open.  Your pain medicine is not helping.  You feel dizzy or light-headed.  You have pain or bleeding when you urinate.  You have persistent nausea and vomiting.  You have blood, pus, or a bad-smelling discharge from your vagina. Get help right away if:  You have a fever.  You have severe abdominal pain.  You have chest pain.  You have  shortness of breath.  You faint.  You have pain, swelling, or redness in your leg.  You have heavy bleeding from your vagina. Summary  After the procedure, it is common to have abdominal pain and vaginal bleeding.  You should not drive or lift heavy objects until your health care provider says that it is safe.  Contact your health care provider if you have any symptoms of infection, excessive vaginal bleeding, nausea, vomiting, or shortness of breath. This information is not intended to replace advice given to you by your health care provider. Make sure you discuss any questions you have with your health care provider. Document Revised: 12/13/2016 Document Reviewed: 02/27/2016 Elsevier Patient Education  2020 [employer]. Call office with any concerns ( [phone]

## 2019-10-29 NOTE — Discharge Summary (Signed)
Physician Discharge Summary  Patient ID: Nicole Mcintyre MRN: [id number] DOB/AGE: 39-02-1980 39 y.o.  Admit date: 10/28/2019 Discharge date: 10/29/2019  Admission Diagnoses:  Discharge Diagnoses:  Active Problems:   Pelvic pain   Post-operative state   Discharged Condition: stable  Hospital Course: Pt did well overnight - stable for discharge to home   Consults: None  Significant Diagnostic Studies: labs: wnl  Treatments: surgery: Total laparoscopic hysterectomy with bilateral salpingectomy and cystoscopy  Discharge Exam: Blood pressure (!) 146/56, pulse 77, temperature 98.2 F (36.8 C), resp. rate 16, height 5' 4.25" (1.632 m), weight 119 kg, SpO2 97 %. General appearance: alert, cooperative and no distress GI: soft, non-tender; bowel sounds normal; no masses,  no organomegaly Incision/Wound:  Disposition: Discharge disposition: 01-Home or Self Care       Discharge Instructions    Call MD for:  difficulty breathing, headache or visual disturbances   Complete by: As directed    Call MD for:  redness, tenderness, or signs of infection (pain, swelling, redness, odor or green/yellow discharge around incision site)   Complete by: As directed    Call MD for:  severe uncontrolled pain   Complete by: As directed    Call MD for:  temperature >100.4   Complete by: As directed    Diet - low sodium heart healthy   Complete by: As directed    Discharge wound care:   Complete by: As directed    May shower, path dry; no tub baths   Driving Restrictions   Complete by: As directed    None while taking narcotic medications   Increase activity slowly   Complete by: As directed    Lifting restrictions   Complete by: As directed    <15lbs   No dressing needed   Complete by: As directed    Sexual Activity Restrictions   Complete by: As directed    None for 6 weeks       Follow-up Information    Schedule an appointment as soon as possible for a visit in 2 weeks to follow  up.   Why: For incision check and in 6 weeks for post op visit       Edwinna Areola, DO.   Specialty: Obstetrics and Gynecology Contact information: [address] [place] [address] [place] [address] [phone]               Signed: Cathrine Muster 10/29/2019, 9:25 AM

## 2019-10-29 NOTE — Progress Notes (Signed)
10/29/2019 10:56 AM Received phone call from patient post discharge stating she had left two crystals in front pocket of her hospital gown. States the were like "diamond earrings" to her. Laundry bag searched and two brown crystals found. One loose and one on gold chain. Patient contacted and made aware they were found and that they would be secured in PACU locked cart when she arrives to pick them up. Pt. Verbalized appreciation and that she would be by shortly to retrieve the items.  Rekita Miotke, Blanchard Kelch

## 2019-10-29 NOTE — Progress Notes (Signed)
Pt stopped by to retreive items left behind when discharged earlier today.

## 2019-11-01 ENCOUNTER — Encounter (HOSPITAL_BASED_OUTPATIENT_CLINIC_OR_DEPARTMENT_OTHER): Payer: Self-pay | Admitting: Obstetrics and Gynecology

## 2019-11-01 LAB — SURGICAL PATHOLOGY

## 2019-11-05 DIAGNOSIS — Z20822 Contact with and (suspected) exposure to covid-19: Secondary | ICD-10-CM | POA: Diagnosis not present

## 2019-11-22 DIAGNOSIS — Z09 Encounter for follow-up examination after completed treatment for conditions other than malignant neoplasm: Secondary | ICD-10-CM | POA: Diagnosis not present

## 2019-11-22 DIAGNOSIS — N760 Acute vaginitis: Secondary | ICD-10-CM | POA: Diagnosis not present

## 2021-12-18 ENCOUNTER — Encounter: Payer: Self-pay | Admitting: Primary Care

## 2021-12-18 ENCOUNTER — Ambulatory Visit: Payer: 59 | Admitting: Primary Care

## 2021-12-18 VITALS — BP 98/64 | HR 95 | Temp 97.3°F | Ht 64.25 in | Wt 244.0 lb

## 2021-12-18 DIAGNOSIS — Z1231 Encounter for screening mammogram for malignant neoplasm of breast: Secondary | ICD-10-CM

## 2021-12-18 DIAGNOSIS — J101 Influenza due to other identified influenza virus with other respiratory manifestations: Secondary | ICD-10-CM

## 2021-12-18 DIAGNOSIS — R051 Acute cough: Secondary | ICD-10-CM

## 2021-12-18 LAB — POCT INFLUENZA A/B: Influenza A, POC: POSITIVE — AB

## 2021-12-18 LAB — POC COVID19 BINAXNOW: SARS Coronavirus 2 Ag: NEGATIVE

## 2021-12-18 MED ORDER — OSELTAMIVIR PHOSPHATE 75 MG PO CAPS: 75.0000 mg | ORAL_CAPSULE | Freq: Two times a day (BID) | ORAL | 0 refills | Status: DC

## 2021-12-18 NOTE — Progress Notes (Signed)
Subjective:    Patient ID: SUMIRE HALBLEIB, female    DOB: 03-18-1980, 41 y.o.   MRN: [id number]  Cough Associated symptoms include chills, headaches and myalgias. Pertinent negatives include no chest pain, ear pain or sore throat.    Winter Jocelyn Slaby is a very pleasant 41 y.o. female who presents today to discuss cough.   Symptom onset three days ago with a dry cough. She then developed she felt like she was "hit by a ton of bricks" with fatigue, headaches, body aches, chest heaviness, decreased appetite.   She completed a home Covid-19 test yesterday which was negative. She has been trying to increase her intake of water. She's taken Robitussin, Tylenol Severe Cold and Flu.   She has noticed acute right breast pain over the last two days. She describes her pain as stabbing and intermittent."Feels like something is twisting inside". She denies masses, changes in skin texture.   She's never had a mammogram.    Review of Systems  Constitutional:  Positive for chills and fatigue.  HENT:  Negative for ear pain and sore throat.   Respiratory:  Positive for cough.   Cardiovascular:  Negative for chest pain.  Musculoskeletal:  Positive for myalgias.  Neurological:  Positive for headaches.         Past Medical History:  Diagnosis Date   AMA (advanced maternal age) multigravida 35+    Anemia    Chicken pox    Cystitis    Diverticulitis    History of fainting spells of unknown cause 2016   dropped blood sugar   Migraines    Shingles     Social History   Socioeconomic History   Marital status: Single    Spouse name: Not on file   Number of children: 1   Years of education: 14   Highest education level: Not on file  Occupational History   Occupation: Patient Accounting  Tobacco Use   Smoking status: Former    Types: Cigarettes    Quit date: 10/17/2005    Years since quitting: 16.1   Smokeless tobacco: Never  Vaping Use   Vaping Use: Never used  Substance and Sexual  Activity   Alcohol use: Not Currently    Alcohol/week: 1.0 standard drink of alcohol    Types: 1 Glasses of wine per week    Comment: occationally   Drug use: No   Sexual activity: Not on file  Other Topics Concern   Not on file  Social History Narrative   Fun: Draw, vacation, visit family   Social Determinants of Health   Financial Resource Strain: Not on file  Food Insecurity: Not on file  Transportation Needs: Not on file  Physical Activity: Not on file  Stress: Not on file  Social Connections: Not on file  Intimate Partner Violence: Not on file    Past Surgical History:  Procedure Laterality Date   APPENDECTOMY     CESAREAN SECTION     CESAREAN SECTION N/A 05/20/2017   Procedure: REPEAT CESAREAN SECTION;  Surgeon: Huel Cote, MD;  Location: [facility];  Service: Obstetrics;  Laterality: N/A;  MD request EXTRA 30 Mins and an RNFA- Tracey   CYSTOSCOPY N/A 10/28/2019   Procedure: CYSTOSCOPY;  Surgeon: Edwinna Areola, DO;  Location: [facility];  Service: Gynecology;  Laterality: N/A;   LAPAROSCOPIC LYSIS OF ADHESIONS  10/28/2019   Procedure: LAPAROSCOPIC LYSIS OF ADHESIONS;  Surgeon: Edwinna Areola, DO;  Location: [facility];  Service: Gynecology;;   RHINOPLASTY     TOTAL LAPAROSCOPIC HYSTERECTOMY WITH SALPINGECTOMY Bilateral 10/28/2019   Procedure: TOTAL LAPAROSCOPIC HYSTERECTOMY WITH SALPINGECTOMY;  Surgeon: Edwinna Areola, DO;  Location: [facility];  Service: Gynecology;  Laterality: Bilateral;    Family History  Problem Relation Age of Onset   Alcohol abuse Father    Diabetes Father    Arthritis Maternal Grandmother    Breast cancer Maternal Grandmother    Alcohol abuse Maternal Grandfather    Diabetes Paternal Grandmother    Healthy Mother    Anemia Mother    Healthy Paternal Grandfather     Allergies  Allergen Reactions   Latex Swelling    Current Outpatient Medications on  File Prior to Visit  Medication Sig Dispense Refill   acetaminophen (TYLENOL) 325 MG tablet Take 2 tablets (650 mg total) by mouth every 4 (four) hours as needed for mild pain. 28 tablet 1   ibuprofen (ADVIL) 800 MG tablet Take 1 tablet (800 mg total) by mouth every 8 (eight) hours as needed for moderate pain or cramping. 30 tablet 0   erythromycin ophthalmic ointment Place a 1/2 inch ribbon of ointment 4 times a day for 7 days (Patient not taking: Reported on 10/08/2019) 1 g 0   No current facility-administered medications on file prior to visit.    BP 98/64   Pulse 95   Temp (!) 97.3 F (36.3 C) (Temporal)   Ht 5' 4.25" (1.632 m)   Wt 244 lb (110.7 kg)   SpO2 99%   BMI 41.56 kg/m  Objective:   Physical Exam Constitutional:      Appearance: She is ill-appearing.  HENT:     Right Ear: Tympanic membrane and ear canal normal.     Left Ear: Tympanic membrane and ear canal normal.     Nose:     Right Sinus: No maxillary sinus tenderness or frontal sinus tenderness.     Left Sinus: No maxillary sinus tenderness or frontal sinus tenderness.     Mouth/Throat:     Pharynx: No posterior oropharyngeal erythema.  Eyes:     Conjunctiva/sclera: Conjunctivae normal.  Cardiovascular:     Rate and Rhythm: Normal rate and regular rhythm.  Pulmonary:     Effort: Pulmonary effort is normal.     Breath sounds: Normal breath sounds. No wheezing or rales.  Chest:  Breasts:    Right: No swelling, mass, skin change or tenderness.     Left: No swelling, mass, skin change or tenderness.  Musculoskeletal:     Cervical back: Neck supple.  Lymphadenopathy:     Cervical: No cervical adenopathy.     Upper Body:     Right upper body: No axillary adenopathy.     Left upper body: No axillary adenopathy.  Skin:    General: Skin is warm.     Comments: Clammy skin            Assessment & Plan:   Problem List Items Addressed This Visit       Other   Acute cough - Primary    Positive for  influenza A today.  Start Tamiflu 75 mg BID x 5 days.  Continue Tylenol and Robitussin as needed.  It's possible that her breast pain is related to her current flu illness, especially since symptoms began simultaneously.  Will order screening mammogram since she's never had one. If her breast pain persists after she recovers from influenza then we will change  her order to a diagnostic mammogram with right breast ultrasound. She agrees. Breat exam today benign.      Relevant Orders   POC COVID-19   Influenza A/B   Other Visit Diagnoses     Encounter for screening mammogram for malignant neoplasm of breast       Relevant Orders   MM 3D SCREEN BREAST BILATERAL   Influenza A       Relevant Medications   oseltamivir (TAMIFLU) 75 MG capsule          Doreene Nest, NP

## 2021-12-18 NOTE — Patient Instructions (Signed)
Start Tamiflu medication for flu. Take 1 capsule by mouth twice daily for 5 days. Drink plenty of fluids and rest.   [facility] to [facility] your [facility].   It was a pleasure to see you today!

## 2021-12-18 NOTE — Assessment & Plan Note (Addendum)
Positive for influenza A today.  Start Tamiflu 75 mg BID x 5 days.  Continue Tylenol and Robitussin as needed.  It's possible that her breast pain is related to her current flu illness, especially since symptoms began simultaneously.  Will order screening mammogram since she's never had one. If her breast pain persists after she recovers from influenza then we will change her order to a diagnostic mammogram with right breast ultrasound. She agrees. Breat exam today benign.

## 2022-05-17 ENCOUNTER — Ambulatory Visit
Admission: RE | Admit: 2022-05-17 | Discharge: 2022-05-17 | Disposition: A | Discharge status: Home / Self Care | Payer: 59 | Source: Ambulatory Visit | Attending: Primary Care | Admitting: Primary Care

## 2022-05-17 DIAGNOSIS — Z1231 Encounter for screening mammogram for malignant neoplasm of breast: Secondary | ICD-10-CM | POA: Diagnosis not present

## 2023-05-08 ENCOUNTER — Telehealth: Payer: Self-pay

## 2023-05-08 ENCOUNTER — Ambulatory Visit (INDEPENDENT_AMBULATORY_CARE_PROVIDER_SITE_OTHER): Admitting: Family

## 2023-05-08 ENCOUNTER — Ambulatory Visit: Payer: Self-pay

## 2023-05-08 ENCOUNTER — Encounter: Payer: Self-pay | Admitting: Family

## 2023-05-08 VITALS — BP 126/84 | HR 75 | Temp 98.5°F | Ht 64.25 in | Wt 260.2 lb

## 2023-05-08 DIAGNOSIS — L03213 Periorbital cellulitis: Secondary | ICD-10-CM

## 2023-05-08 MED ORDER — ERYTHROMYCIN 5 MG/GM OP OINT: 1.0000 | TOPICAL_OINTMENT | Freq: Four times a day (QID) | OPHTHALMIC | 0 refills | Status: AC

## 2023-05-08 MED ORDER — AMOXICILLIN-POT CLAVULANATE 875-125 MG PO TABS: 1.0000 | ORAL_TABLET | Freq: Two times a day (BID) | ORAL | 0 refills | Status: DC

## 2023-05-08 NOTE — Assessment & Plan Note (Signed)
 Suspect this is the condition at currently likely exacerbated by allergies.  Reassuring physical exam to r/o orbital cellulitis.  Did discuss ER precautions however.  rx augmentin  875/125 mg po bid x 10 days Ibuprofen  for pain, zyrtec to start at night for allergies.   Low suspicion for conjunctivitis, only slight crusting not evidenced on PE but per pt report, so will send erythromycin  ointment if treatment failure augmentin  only because pt is going out of town. Did discuss do not use if there is clinical improvement.

## 2023-05-08 NOTE — Telephone Encounter (Signed)
 Copied from CRM [id number]. Topic: Clinical - Prescription Issue >> May 08, 2023 10:35 AM [place] J wrote: Reason for CRM: Patient is calling in regarding her amoxicillin -clavulanate (AUGMENTIN ) 875-125 MG tablet. [facility] [address] [facility] at their location and she would like if medication could be sent to:  [facility] [address], [place], [place] [address] Phone: [phone]

## 2023-05-08 NOTE — Progress Notes (Signed)
 Established Patient Office Visit  Subjective:   Patient ID: Nicole Mcintyre, female    DOB: [birth year]  Age: 43 y.o. MRN: [id number]  CC:  Chief Complaint  Patient presents with   Acute Visit    Eyelid swelling x4-5 days    HPI: Nicole Mcintyre is a 43 y.o. female presenting on 05/08/2023 for Acute Visit (Eyelid swelling x4-5 days)  About four to five days ago she noticed some swelling around her left eye. She thought it might be a stye so has been using warm compresses but she states there has no improving. Clear watery drainage. She does report a gritty sensation in her left eye without blurry vision. It does itch and she does report some pain behind her eye a bit.   Did have some slight crusting along her lash line.  Does have a lot of allergies going on as well.  She has not taken any allergy medications.        ROS: Negative unless specifically indicated above in HPI.   Relevant past medical history reviewed and updated as indicated.   Allergies and medications reviewed and updated.   Current Outpatient Medications:    amoxicillin -clavulanate (AUGMENTIN ) 875-125 MG tablet, Take 1 tablet by mouth 2 (two) times daily., Disp: 20 tablet, Rfl: 0   erythromycin  ophthalmic ointment, Place 1 Application into the left eye 4 (four) times daily for 7 days., Disp: 28 g, Rfl: 0  Allergies  Allergen Reactions   Latex Swelling    Objective:   BP 126/84 (BP Location: Left Arm, Patient Position: Sitting, Cuff Size: Large)   Pulse 75   Temp 98.5 F (36.9 C) (Temporal)   Ht 5' 4.25" (1.632 m)   Wt 260 lb 3.2 oz (118 kg)   LMP 09/26/2019   SpO2 99%   BMI 44.32 kg/m    Physical Exam Vitals reviewed.  Constitutional:      General: She is not in acute distress.    Appearance: Normal appearance. She is normal weight. She is not ill-appearing, toxic-appearing or diaphoretic.  HENT:     Head: Normocephalic.     Right Ear: Tympanic membrane normal.     Left Ear: Tympanic  membrane normal.     Nose: Nose normal.     Right Turbinates: Enlarged and swollen.     Left Turbinates: Enlarged and swollen.     Right Sinus: No maxillary sinus tenderness or frontal sinus tenderness.     Left Sinus: No maxillary sinus tenderness or frontal sinus tenderness.     Mouth/Throat:     Mouth: Mucous membranes are dry.     Pharynx: No oropharyngeal exudate, posterior oropharyngeal erythema or postnasal drip.  Eyes:     General: Vision grossly intact. Gaze aligned appropriately. No allergic shiner, visual field deficit or scleral icterus.       Left eye: No foreign body or discharge.     Extraocular Movements: Extraocular movements intact.     Left eye: Normal extraocular motion.     Pupils: Pupils are equal, round, and reactive to light.     Comments: Left upper eyelid with profuse edema and erythema with tenderness on palpation  Cardiovascular:     Rate and Rhythm: Normal rate and regular rhythm.     Pulses: Normal pulses.     Heart sounds: Normal heart sounds.  Pulmonary:     Effort: Pulmonary effort is normal.     Breath sounds: Normal breath sounds.  Musculoskeletal:  Cervical back: Normal range of motion.  Neurological:     General: No focal deficit present.     Mental Status: She is alert and oriented to person, place, and time. Mental status is at baseline.  Psychiatric:        Mood and Affect: Mood normal.        Behavior: Behavior normal.        Thought Content: Thought content normal.        Judgment: Judgment normal.          Assessment & Plan:  Periorbital cellulitis of left eye Assessment & Plan: Suspect this is the condition at currently likely exacerbated by allergies.  Reassuring physical exam to r/o orbital cellulitis.  Did discuss ER precautions however.  rx augmentin  875/125 mg po bid x 10 days Ibuprofen  for pain, zyrtec to start at night for allergies.   Low suspicion for conjunctivitis, only slight crusting not evidenced on PE but  per pt report, so will send erythromycin  ointment if treatment failure augmentin  only because pt is going out of town. Did discuss do not use if there is clinical improvement.   Orders: -     Amoxicillin -Pot Clavulanate; Take 1 tablet by mouth 2 (two) times daily.  Dispense: 20 tablet; Refill: 0 -     Erythromycin ; Place 1 Application into the left eye 4 (four) times daily for 7 days.  Dispense: 28 g; Refill: 0     Follow up plan: Return for f/u PCP if no improvement in symptoms.  Felicita Horns, FNP

## 2023-05-08 NOTE — Telephone Encounter (Signed)
 Copied from CRM [id number]. Topic: Clinical - Red Word Triage >> May 08, 2023  8:20 AM Bambi Bonine D wrote: Red Word that prompted transfer to Nurse Triage: Swollen, Pain  Patient stated that her left eye is swollen and its very painful. Patient stated that she can still see out of her eye but it feels as if the left side of her face is also swollen because of her eye.   Chief Complaint: Left eye pain, swelling Symptoms: pain, swelling,  Frequency: 4-5 days ago Pertinent Negatives: Patient denies fever, nausea, vomiting, diarrhea, chest pain, difficulty breathing, injury to eye, drainage Disposition: [] ED /[] Urgent Care (no appt availability in office) / [x] Appointment(In office/virtual)/ []  [facility]/ [] Home Care/ [] Refused Recommended Disposition /[]  Mobile Bus/ []  Follow-up with PCP Additional Notes: Patient called and advised that 4-5 days ago she started noticing her upper eyelid on her left eye is having swelling/pain/slight itching. Patient denies any itching, fever, nausea, vomiting, diarrhea, chest pain, difficulty breathing, injury to eye, drainage. Patient states that Tuesday it started getting worse. She has been using warm compresses. She states that her eye is almost swollen shut and it is tender around the outside of the eye. Patient states that in the past she has had calcium deposits in her eye that scratches her eye but this doesn't feel like that and she hasn't injured her eye. Appointment made for today 05/08/2023 at 9:40 am at her PCP Office with Felicita Horns. Patient is advised that if anything worsens, to go to the Emergency Room. Patient verbalized understanding.  Reason for Disposition  [1] SEVERE eyelid swelling on one side AND [2] red and painful (or tender to touch)  Answer Assessment - Initial Assessment Questions 1. ONSET: "When did the swelling start?" (e.g., minutes, hours, days)     4-5 days ago 2. LOCATION: "What part of the eyelids is  swollen?"     Upper and outer of left eye 3. SEVERITY: "How swollen is it?"     Patient states she can see but it is almost swollen shut 4. ITCHING: "Is there any itching?" If Yes, ask: "How much?"   (Scale 1-10; mild, moderate or severe)     4 5. PAIN: "Is the swelling painful to touch?" If Yes, ask: "How painful is it?"   (Scale 1-10; mild, moderate or severe)     7 6. FEVER: "Do you have a fever?" If Yes, ask: "What is it, how was it measured, and when did it start?"      No 7. CAUSE: "What do you think is causing the swelling?"     Unsure 8. RECURRENT SYMPTOM: "Have you had eyelid swelling before?" If Yes, ask: "When was the last time?" "What happened that time?"     Years ago something similar but it didn't last this long 9. OTHER SYMPTOMS: "Do you have any other symptoms?" (e.g., blurred vision, eye discharge, rash, runny nose)     Some itching 10. PREGNANCY: "Is there any chance you are pregnant?" "When was your last menstrual period?"       No--hysterectomy 3 years ago  Protocols used: Eye - Swelling-A-AH

## 2023-05-08 NOTE — Telephone Encounter (Signed)
 Saw patient already, thank you for speaking with the patient.  Please see note for further information.

## 2023-05-08 NOTE — Telephone Encounter (Signed)
Routing to treating provider.

## 2023-05-09 MED ORDER — AMOXICILLIN-POT CLAVULANATE 875-125 MG PO TABS: 1.0000 | ORAL_TABLET | Freq: Two times a day (BID) | ORAL | 0 refills | Status: AC

## 2023-05-09 NOTE — Telephone Encounter (Signed)
 Rx has been resent to requested pharmacy  ?

## 2023-05-09 NOTE — Addendum Note (Signed)
 Addended by: Dewanda Foots C on: 05/09/2023 07:46 AM   Modules accepted: Orders
